# Patient Record
Sex: Male | Born: 1955 | Race: Black or African American | Hispanic: No | Marital: Single | Smoking: Never smoker
Health system: Southern US, Community
[De-identification: ages and names within clinical notes are randomized; demographics above are authoritative.]

## PROBLEM LIST (undated history)

## (undated) DIAGNOSIS — I4891 Unspecified atrial fibrillation: Secondary | ICD-10-CM

## (undated) DIAGNOSIS — F259 Schizoaffective disorder, unspecified: Secondary | ICD-10-CM

## (undated) DIAGNOSIS — M109 Gout, unspecified: Secondary | ICD-10-CM

## (undated) DIAGNOSIS — E119 Type 2 diabetes mellitus without complications: Secondary | ICD-10-CM

## (undated) DIAGNOSIS — N183 Chronic kidney disease, stage 3 unspecified: Secondary | ICD-10-CM

## (undated) DIAGNOSIS — I10 Essential (primary) hypertension: Secondary | ICD-10-CM

## (undated) HISTORY — PX: OTHER SURGICAL HISTORY: SHX169

---

## 1998-02-26 ENCOUNTER — Encounter: Payer: Self-pay | Admitting: Emergency Medicine

## 1998-02-26 ENCOUNTER — Emergency Department (HOSPITAL_COMMUNITY): Admission: EM | Admit: 1998-02-26 | Discharge: 1998-02-26 | Payer: Self-pay | Admitting: Emergency Medicine

## 1999-06-15 ENCOUNTER — Emergency Department (HOSPITAL_COMMUNITY): Admission: EM | Admit: 1999-06-15 | Discharge: 1999-06-15 | Payer: Self-pay | Admitting: Emergency Medicine

## 1999-11-28 ENCOUNTER — Emergency Department (HOSPITAL_COMMUNITY): Admission: EM | Admit: 1999-11-28 | Discharge: 1999-11-28 | Payer: Self-pay | Admitting: Emergency Medicine

## 1999-12-16 ENCOUNTER — Inpatient Hospital Stay (HOSPITAL_COMMUNITY): Admission: EM | Admit: 1999-12-16 | Discharge: 1999-12-17 | Payer: Self-pay | Admitting: Emergency Medicine

## 2002-01-09 DIAGNOSIS — F250 Schizoaffective disorder, bipolar type: Secondary | ICD-10-CM | POA: Diagnosis present

## 2011-01-10 DIAGNOSIS — F1910 Other psychoactive substance abuse, uncomplicated: Secondary | ICD-10-CM | POA: Diagnosis present

## 2015-01-06 DIAGNOSIS — I10 Essential (primary) hypertension: Secondary | ICD-10-CM | POA: Diagnosis present

## 2015-01-06 DIAGNOSIS — M109 Gout, unspecified: Secondary | ICD-10-CM | POA: Diagnosis present

## 2016-02-18 DIAGNOSIS — N183 Chronic kidney disease, stage 3 unspecified: Secondary | ICD-10-CM | POA: Diagnosis present

## 2016-06-25 ENCOUNTER — Emergency Department (HOSPITAL_COMMUNITY)
Admission: EM | Admit: 2016-06-25 | Discharge: 2016-06-25 | Disposition: A | Discharge status: Home / Self Care | Payer: Medicaid Other | Attending: Emergency Medicine | Admitting: Emergency Medicine

## 2016-06-25 ENCOUNTER — Encounter (HOSPITAL_COMMUNITY): Payer: Self-pay

## 2016-06-25 DIAGNOSIS — Z79899 Other long term (current) drug therapy: Secondary | ICD-10-CM | POA: Insufficient documentation

## 2016-06-25 DIAGNOSIS — M10041 Idiopathic gout, right hand: Secondary | ICD-10-CM | POA: Diagnosis not present

## 2016-06-25 DIAGNOSIS — I10 Essential (primary) hypertension: Secondary | ICD-10-CM | POA: Insufficient documentation

## 2016-06-25 DIAGNOSIS — M109 Gout, unspecified: Secondary | ICD-10-CM

## 2016-06-25 HISTORY — DX: Gout, unspecified: M10.9

## 2016-06-25 HISTORY — DX: Essential (primary) hypertension: I10

## 2016-06-25 MED ORDER — PREDNISONE 20 MG PO TABS
60.0000 mg | ORAL_TABLET | Freq: Once | ORAL | Status: AC
  Administered 2016-06-25: 60 mg via ORAL
  Filled 2016-06-25: qty 3

## 2016-06-25 MED ORDER — HYDRALAZINE HCL 10 MG PO TABS
10.0000 mg | ORAL_TABLET | Freq: Once | ORAL | Status: DC
  Filled 2016-06-25 (×2): qty 1

## 2016-06-25 MED ORDER — OXYCODONE-ACETAMINOPHEN 5-325 MG PO TABS
1.0000 | ORAL_TABLET | Freq: Once | ORAL | Status: AC
  Administered 2016-06-25: 1 via ORAL
  Filled 2016-06-25: qty 1

## 2016-06-25 MED ORDER — PREDNISONE 20 MG PO TABS: 60.0000 mg | ORAL_TABLET | Freq: Every day | ORAL | 0 refills | Status: DC

## 2016-06-25 MED ORDER — OXYCODONE-ACETAMINOPHEN 5-325 MG PO TABS: 1.0000 | ORAL_TABLET | ORAL | 0 refills | Status: DC | PRN

## 2016-06-25 NOTE — ED Provider Notes (Signed)
[facility]-EMERGENCY DEPT Provider Note   CSN: [id number] Arrival date & time: 06/25/16  1116  By signing my name below, I, Rosario Adie, attest that this documentation has been prepared under the direction and in the presence of Heide Scales, MD. Electronically Signed: Rosario Adie, ED Scribe. 06/25/16. 12:22 PM.  History   Chief Complaint Chief Complaint  Patient presents with  . Gout   The history is provided by the patient and medical records. No language interpreter was used.  Hand Pain  This is a new problem. The current episode started yesterday. The problem occurs constantly. The problem has been gradually worsening. Pertinent negatives include no chest pain, no abdominal pain, no headaches and no shortness of breath. Exacerbated by: movement of the right hand and elbow. Nothing relieves the symptoms. He has tried nothing for the symptoms. The treatment provided no relief.    HPI Comments: Xavier Ford is a right-hand dominant 61 y.o. male with a h/o HTN, gout, CKD stage III, paroxysmal AFIB (not on anticoagulant therapy 2/2 substance abuse), who presents to the Emergency Department complaining of persistent, gradually worsening, right lateral hand and elbow pain beginning last night. He rates his current pain as 10/10. He notes associated swelling over the hand and lower forearm. Per pt, he has a h/o Gout and his pain today is consistent with usual flare ups. No recent trauma or injury to the hand. His pain is exacerbated with movements of his arm. No noted treatments were tried for his current course of pain. Per prior chart review, he was recently admitted to ICU on 06/15/16 and d/c'd on 06/17/16 for chest pain at rest. CL was positive for ischemia inferoapical and lateral wall, ECHO pEF, DD grade. He was previously on Colchicine but this was was stopped after he was discharged from his admission. He denies urgency, frequency, hematuria, dysuria, difficulty  urinating, constipation, fever, rhinorrhea, cough, chest pain, shortness of breath, or any other associated symptoms.    Past Medical History:  Diagnosis Date  . Gout   . Hypertension    There are no active problems to display for this patient.  History reviewed. No pertinent surgical history.  Home Medications    Prior to Admission medications   Not on File   Family History History reviewed. No pertinent family history.  Social History Social History  Substance Use Topics  . Smoking status: Never Smoker  . Smokeless tobacco: Never Used  . Alcohol use No   Allergies   Nsaids  Review of Systems Review of Systems  Constitutional: Negative for activity change, chills, diaphoresis, fatigue and fever.  HENT: Negative for congestion and rhinorrhea.   Eyes: Negative for visual disturbance.  Respiratory: Negative for cough, chest tightness, shortness of breath, wheezing and stridor.   Cardiovascular: Negative for chest pain, palpitations and leg swelling.  Gastrointestinal: Negative for abdominal distention, abdominal pain, blood in stool, constipation, diarrhea, nausea and vomiting.  Genitourinary: Negative for difficulty urinating, dysuria, flank pain, frequency, hematuria and urgency.  Musculoskeletal: Positive for arthralgias (right hand), joint swelling and myalgias. Negative for back pain and gait problem.  Skin: Negative for rash and wound.  Neurological: Negative for dizziness, weakness, light-headedness and headaches.  Psychiatric/Behavioral: Negative for agitation.  All other systems reviewed and are negative.  Physical Exam Updated Vital Signs BP (!) 186/102 (BP Location: Left Arm)   Pulse 94   Temp 98.7 F (37.1 C) (Oral)   Resp 22   Ht 6\' 4"  (1.93 m)  Wt 240 lb (108.9 kg)   SpO2 100%   BMI 29.21 kg/m   Physical Exam  Constitutional: He is oriented to person, place, and time. He appears well-developed and well-nourished. No distress.  HENT:  Head:  Normocephalic and atraumatic.  Right Ear: External ear normal.  Left Ear: External ear normal.  Nose: Nose normal.  Mouth/Throat: Oropharynx is clear and moist. No oropharyngeal exudate.  Eyes: Conjunctivae and EOM are normal. Pupils are equal, round, and reactive to light.  Neck: Normal range of motion. Neck supple.  Pulmonary/Chest: No stridor. No respiratory distress.  Abdominal: Soft. There is no tenderness. There is no rebound and no guarding.  Musculoskeletal: He exhibits tenderness. He exhibits no edema.  Normal sensation, grip strength limited d/t pain. Tenderness in the lateral hypothenar right hand and elbow. No crepitus, erythema, or fluctuance.   Neurological: He is alert and oriented to person, place, and time. He displays normal reflexes. No cranial nerve deficit. He exhibits normal muscle tone. Coordination normal.  Skin: Skin is warm. No rash noted. He is not diaphoretic. No erythema.   ED Treatments / Results  DIAGNOSTIC STUDIES: Oxygen Saturation is 100% on RA, normal by my interpretation.   COORDINATION OF CARE: 12:19 PM-Discussed next steps with pt. Pt verbalized understanding and is agreeable with the plan.   Labs (all labs ordered are listed, but only abnormal results are displayed) Labs Reviewed - No data to display  EKG  EKG Interpretation None      Radiology No results found.  Procedures Procedures   Medications Ordered in ED Medications  predniSONE (DELTASONE) tablet 60 mg (60 mg Oral Given 06/25/16 1230)  oxyCODONE-acetaminophen (PERCOCET/ROXICET) 5-325 MG per tablet 1 tablet (1 tablet Oral Given 06/25/16 1231)    Initial Impression / Assessment and Plan / ED Course  I have reviewed the triage vital signs and the nursing notes.  Pertinent labs & imaging results that were available during my care of the patient were reviewed by me and considered in my medical decision making (see chart for details).     Xavier Ford is a right-hand  dominant 61 y.o. male with a h/o HTN, gout, CKD stage III, paroxysmal AFIB (not on anticoagulant therapy 2/2 substance abuse), who presents to the Emergency Department complaining of persistent, gradually worsening, right lateral hand and elbow pain beginning last night. Ports his symptoms are "the exact same as my last gout flare". Patient says his pain starts the same, elbow and eventually located to his knees if he does not get pain medication steroids.   On exam, patient has clear lungs and nontender chest or abdomen. Patient has pain in his right elbow and right hand. Patient is right-handed. Patient has normal sensation. Grip strength and range of motion are limited secondary to pain. No evidence of infection on exam with no erythema, crepitance, or fluctuance.  Patient reports that he had to stop taking colchicine due to his new medications and his kidney troubles from his recent admission. Patient says that he continues take his allopurinol.  Patient says that his pain will be relieved with Percocet and steroids. Patient was given these medications and will be reassessed.  Patient reports significant improvement in his discomfort after medications. Patient has full range of motion of his wrist, hand, and elbow. Patient says he still has some pain but is improved.  Given improvement in symptoms, patient was discharged. Patient given prescription for steroids for several days for a burst as well as some pain  medications. Patient instructed to continue his outpatient regimen as directed. Patient instructed to follow-up with PCP for further managed. Patient understood return precautions for new or worsening symptoms. Patient other questions or concerns and patient was discharged in good condition.  Just prior to discharge, patient wanted to take a blood pressure pill. Patient's recent discharge information was reviewed and patient has a prescription for hydralazine. One dose was given orally. Patient  discharged in good condition.     Final Clinical Impressions(s) / ED Diagnoses   Final diagnoses:  Acute gout of right hand, unspecified cause   New Prescriptions New Prescriptions   OXYCODONE-ACETAMINOPHEN (PERCOCET/ROXICET) 5-325 MG TABLET    Take 1 tablet by mouth every 4 (four) hours as needed for severe pain.   PREDNISONE (DELTASONE) 20 MG TABLET    Take 3 tablets (60 mg total) by mouth daily.    I personally performed the services described in this documentation, which was scribed in my presence. The recorded information has been reviewed and is accurate.    Clinical Impression: 1. Acute gout of right hand, unspecified cause     Disposition: Discharge  Condition: Good  I have discussed the results, Dx and Tx plan with the pt(& family if present). He/she/they expressed understanding and agree(s) with the plan. Discharge instructions discussed at great length. Strict return precautions discussed and pt &/or family have verbalized understanding of the instructions. No further questions at time of discharge.    New Prescriptions   OXYCODONE-ACETAMINOPHEN (PERCOCET/ROXICET) 5-325 MG TABLET    Take 1 tablet by mouth every 4 (four) hours as needed for severe pain.   PREDNISONE (DELTASONE) 20 MG TABLET    Take 3 tablets (60 mg total) by mouth daily.    Follow Up: No Pcp Per Patient  Schedule an appointment as soon as possible for a visit    [facility] EMERGENCY DEPARTMENT [address] [id number] [place] [address] [phone]  If symptoms worsen     Heide Scales, MD 06/25/16 Rickey Primus

## 2016-06-25 NOTE — ED Triage Notes (Signed)
Pt reports right arm pain in the hand and his elbow. He reports hx of gout.

## 2016-06-25 NOTE — Discharge Instructions (Signed)
Please take your steroids for the next few days as directed. Please take the pain medicine as needed to help her symptoms. Please scheduled with appointment to follow-up with your PCP for further management as well as further blood pressure management. If any symptoms worsen or don't improve, please return to the nearest emergency department.

## 2016-06-25 NOTE — ED Notes (Signed)
[facility]  Spoke to social work and she stated they could get him a bus pass.  She will meet pt in waiting room.

## 2016-06-25 NOTE — Progress Notes (Signed)
CSW provided Patient with a bus pass and [place] [facility] [place] CSW signing off at this time. Please contact should new need(s) arise.    Enos FlingAshley Adelyn Roscher, MSW, LCSW [facility]/64[facility] Clinical Social Worker [phone]

## 2016-06-25 NOTE — ED Notes (Signed)
PT did not want to wait for med to come up from pharmacy.

## 2016-06-25 NOTE — ED Notes (Signed)
PT states improvement.  Is not bending arm and bp is decreasing.

## 2016-06-25 NOTE — ED Notes (Signed)
PT states only 30 mg oxycontin will work

## 2016-06-26 ENCOUNTER — Encounter (HOSPITAL_COMMUNITY): Payer: Self-pay | Admitting: Emergency Medicine

## 2016-06-26 DIAGNOSIS — I25110 Atherosclerotic heart disease of native coronary artery with unstable angina pectoris: Principal | ICD-10-CM | POA: Diagnosis present

## 2016-06-26 DIAGNOSIS — Z9119 Patient's noncompliance with other medical treatment and regimen: Secondary | ICD-10-CM

## 2016-06-26 DIAGNOSIS — Z7982 Long term (current) use of aspirin: Secondary | ICD-10-CM

## 2016-06-26 DIAGNOSIS — I129 Hypertensive chronic kidney disease with stage 1 through stage 4 chronic kidney disease, or unspecified chronic kidney disease: Secondary | ICD-10-CM | POA: Diagnosis present

## 2016-06-26 DIAGNOSIS — E669 Obesity, unspecified: Secondary | ICD-10-CM | POA: Diagnosis present

## 2016-06-26 DIAGNOSIS — G8929 Other chronic pain: Secondary | ICD-10-CM | POA: Diagnosis present

## 2016-06-26 DIAGNOSIS — I480 Paroxysmal atrial fibrillation: Secondary | ICD-10-CM | POA: Diagnosis present

## 2016-06-26 DIAGNOSIS — F250 Schizoaffective disorder, bipolar type: Secondary | ICD-10-CM | POA: Diagnosis present

## 2016-06-26 DIAGNOSIS — M109 Gout, unspecified: Secondary | ICD-10-CM | POA: Diagnosis present

## 2016-06-26 DIAGNOSIS — N183 Chronic kidney disease, stage 3 (moderate): Secondary | ICD-10-CM | POA: Diagnosis present

## 2016-06-26 DIAGNOSIS — E785 Hyperlipidemia, unspecified: Secondary | ICD-10-CM | POA: Diagnosis present

## 2016-06-26 DIAGNOSIS — I700 Atherosclerosis of aorta: Secondary | ICD-10-CM | POA: Diagnosis present

## 2016-06-26 DIAGNOSIS — F1410 Cocaine abuse, uncomplicated: Secondary | ICD-10-CM | POA: Diagnosis present

## 2016-06-26 DIAGNOSIS — Z79899 Other long term (current) drug therapy: Secondary | ICD-10-CM

## 2016-06-26 DIAGNOSIS — I4892 Unspecified atrial flutter: Secondary | ICD-10-CM | POA: Diagnosis present

## 2016-06-26 LAB — BASIC METABOLIC PANEL
ANION GAP: 11 (ref 5–15)
BUN: 22 mg/dL — ABNORMAL HIGH (ref 6–20)
CALCIUM: 9.7 mg/dL (ref 8.9–10.3)
CHLORIDE: 102 mmol/L (ref 101–111)
CO2: 23 mmol/L (ref 22–32)
Creatinine, Ser: 1.87 mg/dL — ABNORMAL HIGH (ref 0.61–1.24)
GFR calc non Af Amer: 37 mL/min — ABNORMAL LOW (ref >60)
GFR, EST AFRICAN AMERICAN: 43 mL/min — AB (ref >60)
Glucose, Bld: 123 mg/dL — ABNORMAL HIGH (ref 65–99)
POTASSIUM: 3.6 mmol/L (ref 3.5–5.1)
Sodium: 136 mmol/L (ref 135–145)

## 2016-06-26 LAB — CBC
HEMATOCRIT: 40.3 % (ref 39.0–52.0)
HEMOGLOBIN: 13.1 g/dL (ref 13.0–17.0)
MCH: 26.9 pg (ref 26.0–34.0)
MCHC: 32.5 g/dL (ref 30.0–36.0)
MCV: 82.8 fL (ref 78.0–100.0)
Platelets: 328 10*3/uL (ref 150–400)
RBC: 4.87 MIL/uL (ref 4.22–5.81)
RDW: 15.2 % (ref 11.5–15.5)
WBC: 6.3 10*3/uL (ref 4.0–10.5)

## 2016-06-26 LAB — I-STAT TROPONIN, ED: TROPONIN I, POC: 0.04 ng/mL (ref 0.00–0.08)

## 2016-06-26 NOTE — ED Triage Notes (Signed)
Pt c/o left side 10/10 cp and sob that started today, pt states he was seen yesterday for gout pain, but today he is having cp with no relief. No nausea or vomiting no fever.

## 2016-06-27 ENCOUNTER — Encounter (HOSPITAL_COMMUNITY): Payer: Self-pay | Admitting: Family Medicine

## 2016-06-27 ENCOUNTER — Inpatient Hospital Stay (HOSPITAL_COMMUNITY)
Admission: EM | Admit: 2016-06-27 | Discharge: 2016-06-30 | DRG: 287 | Disposition: A | Discharge status: Home / Self Care | Payer: Medicaid Other | Attending: Internal Medicine | Admitting: Internal Medicine

## 2016-06-27 ENCOUNTER — Encounter (HOSPITAL_COMMUNITY)
Admission: EM | Disposition: A | Discharge status: Home / Self Care | Payer: Self-pay | Source: Home / Self Care | Attending: Internal Medicine

## 2016-06-27 ENCOUNTER — Emergency Department (HOSPITAL_COMMUNITY): Payer: Medicaid Other

## 2016-06-27 DIAGNOSIS — R079 Chest pain, unspecified: Secondary | ICD-10-CM | POA: Diagnosis not present

## 2016-06-27 DIAGNOSIS — I480 Paroxysmal atrial fibrillation: Secondary | ICD-10-CM | POA: Diagnosis not present

## 2016-06-27 DIAGNOSIS — N183 Chronic kidney disease, stage 3 unspecified: Secondary | ICD-10-CM | POA: Diagnosis present

## 2016-06-27 DIAGNOSIS — I208 Other forms of angina pectoris: Secondary | ICD-10-CM | POA: Diagnosis not present

## 2016-06-27 DIAGNOSIS — I200 Unstable angina: Secondary | ICD-10-CM | POA: Diagnosis present

## 2016-06-27 DIAGNOSIS — I10 Essential (primary) hypertension: Secondary | ICD-10-CM | POA: Diagnosis not present

## 2016-06-27 DIAGNOSIS — M1A3210 Chronic gout due to renal impairment, right elbow, without tophus (tophi): Secondary | ICD-10-CM | POA: Diagnosis not present

## 2016-06-27 DIAGNOSIS — M109 Gout, unspecified: Secondary | ICD-10-CM | POA: Diagnosis present

## 2016-06-27 DIAGNOSIS — R0789 Other chest pain: Secondary | ICD-10-CM

## 2016-06-27 DIAGNOSIS — R931 Abnormal findings on diagnostic imaging of heart and coronary circulation: Secondary | ICD-10-CM | POA: Diagnosis not present

## 2016-06-27 DIAGNOSIS — I25110 Atherosclerotic heart disease of native coronary artery with unstable angina pectoris: Secondary | ICD-10-CM | POA: Diagnosis not present

## 2016-06-27 DIAGNOSIS — F1910 Other psychoactive substance abuse, uncomplicated: Secondary | ICD-10-CM | POA: Diagnosis not present

## 2016-06-27 DIAGNOSIS — F250 Schizoaffective disorder, bipolar type: Secondary | ICD-10-CM | POA: Diagnosis present

## 2016-06-27 HISTORY — DX: Chronic kidney disease, stage 3 unspecified: N18.30

## 2016-06-27 HISTORY — DX: Chronic kidney disease, stage 3 (moderate): N18.3

## 2016-06-27 HISTORY — PX: LEFT HEART CATH AND CORONARY ANGIOGRAPHY: CATH118249

## 2016-06-27 HISTORY — DX: Unspecified atrial fibrillation: I48.91

## 2016-06-27 HISTORY — DX: Schizoaffective disorder, unspecified: F25.9

## 2016-06-27 LAB — BASIC METABOLIC PANEL
ANION GAP: 9 (ref 5–15)
BUN: 24 mg/dL — AB (ref 6–20)
CALCIUM: 9 mg/dL (ref 8.9–10.3)
CO2: 24 mmol/L (ref 22–32)
Chloride: 105 mmol/L (ref 101–111)
Creatinine, Ser: 1.8 mg/dL — ABNORMAL HIGH (ref 0.61–1.24)
GFR calc Af Amer: 45 mL/min — ABNORMAL LOW (ref >60)
GFR, EST NON AFRICAN AMERICAN: 39 mL/min — AB (ref >60)
Glucose, Bld: 100 mg/dL — ABNORMAL HIGH (ref 65–99)
Potassium: 4.1 mmol/L (ref 3.5–5.1)
Sodium: 138 mmol/L (ref 135–145)

## 2016-06-27 LAB — RAPID URINE DRUG SCREEN, HOSP PERFORMED
Amphetamines: NOT DETECTED
Barbiturates: NOT DETECTED
Benzodiazepines: NOT DETECTED
Cocaine: POSITIVE — AB
OPIATES: NOT DETECTED
TETRAHYDROCANNABINOL: NOT DETECTED

## 2016-06-27 LAB — MRSA PCR SCREENING: MRSA by PCR: NEGATIVE

## 2016-06-27 LAB — TROPONIN I
TROPONIN I: 0.03 ng/mL — AB (ref <0.03)
Troponin I: 0.03 ng/mL (ref <0.03)

## 2016-06-27 LAB — PROTIME-INR
INR: 1.02
PROTHROMBIN TIME: 13.4 s (ref 11.4–15.2)

## 2016-06-27 SURGERY — LEFT HEART CATH AND CORONARY ANGIOGRAPHY
Anesthesia: LOCAL

## 2016-06-27 MED ORDER — IOPAMIDOL (ISOVUE-370) INJECTION 76%
INTRAVENOUS | Status: AC
  Filled 2016-06-27: qty 100

## 2016-06-27 MED ORDER — SODIUM CHLORIDE 0.9% FLUSH
3.0000 mL | Freq: Two times a day (BID) | INTRAVENOUS | Status: DC
  Administered 2016-06-27 – 2016-06-30 (×5): 3 mL via INTRAVENOUS

## 2016-06-27 MED ORDER — FENTANYL CITRATE (PF) 100 MCG/2ML IJ SOLN
INTRAMUSCULAR | Status: AC
  Filled 2016-06-27: qty 2

## 2016-06-27 MED ORDER — SODIUM CHLORIDE 0.9% FLUSH: 3.0000 mL | INTRAVENOUS | Status: DC | PRN

## 2016-06-27 MED ORDER — LIDOCAINE HCL (PF) 1 % IJ SOLN
INTRAMUSCULAR | Status: AC
  Filled 2016-06-27: qty 30

## 2016-06-27 MED ORDER — HEPARIN SODIUM (PORCINE) 1000 UNIT/ML IJ SOLN
INTRAMUSCULAR | Status: DC | PRN
  Administered 2016-06-27: 5000 [IU] via INTRAVENOUS

## 2016-06-27 MED ORDER — ENOXAPARIN SODIUM 40 MG/0.4ML ~~LOC~~ SOLN
40.0000 mg | SUBCUTANEOUS | Status: DC
  Administered 2016-06-28: 40 mg via SUBCUTANEOUS
  Filled 2016-06-27: qty 0.4

## 2016-06-27 MED ORDER — SODIUM CHLORIDE 0.9 % WEIGHT BASED INFUSION: 1.0000 mL/kg/h | INTRAVENOUS | Status: DC

## 2016-06-27 MED ORDER — ALLOPURINOL 300 MG PO TABS
300.0000 mg | ORAL_TABLET | Freq: Every day | ORAL | Status: DC
  Administered 2016-06-27 – 2016-06-30 (×4): 300 mg via ORAL
  Filled 2016-06-27 (×4): qty 1

## 2016-06-27 MED ORDER — HEPARIN (PORCINE) IN NACL 2-0.9 UNIT/ML-% IJ SOLN
INTRAMUSCULAR | Status: DC | PRN
  Administered 2016-06-27: 1000 mL

## 2016-06-27 MED ORDER — GABAPENTIN 300 MG PO CAPS
300.0000 mg | ORAL_CAPSULE | Freq: Three times a day (TID) | ORAL | Status: DC
  Administered 2016-06-27 – 2016-06-30 (×9): 300 mg via ORAL
  Filled 2016-06-27 (×9): qty 1

## 2016-06-27 MED ORDER — SODIUM CHLORIDE 0.9 % IV SOLN: 250.0000 mL | INTRAVENOUS | Status: DC | PRN

## 2016-06-27 MED ORDER — HEPARIN (PORCINE) IN NACL 2-0.9 UNIT/ML-% IJ SOLN
INTRAMUSCULAR | Status: AC
  Filled 2016-06-27: qty 1000

## 2016-06-27 MED ORDER — MIDAZOLAM HCL 2 MG/2ML IJ SOLN
INTRAMUSCULAR | Status: AC
  Filled 2016-06-27: qty 2

## 2016-06-27 MED ORDER — BENZTROPINE MESYLATE 1 MG PO TABS
1.0000 mg | ORAL_TABLET | Freq: Two times a day (BID) | ORAL | Status: DC
  Administered 2016-06-27 – 2016-06-30 (×7): 1 mg via ORAL
  Filled 2016-06-27 (×7): qty 1

## 2016-06-27 MED ORDER — ACETAMINOPHEN 325 MG PO TABS: 650.0000 mg | ORAL_TABLET | ORAL | Status: DC | PRN

## 2016-06-27 MED ORDER — ONDANSETRON HCL 4 MG/2ML IJ SOLN: 4.0000 mg | Freq: Four times a day (QID) | INTRAMUSCULAR | Status: DC | PRN

## 2016-06-27 MED ORDER — ATORVASTATIN CALCIUM 80 MG PO TABS
80.0000 mg | ORAL_TABLET | Freq: Every day | ORAL | Status: DC
  Administered 2016-06-27 – 2016-06-30 (×4): 80 mg via ORAL
  Filled 2016-06-27 (×4): qty 1

## 2016-06-27 MED ORDER — ASPIRIN 325 MG PO TABS
325.0000 mg | ORAL_TABLET | Freq: Every day | ORAL | Status: DC
  Administered 2016-06-27: 325 mg via ORAL
  Filled 2016-06-27: qty 1

## 2016-06-27 MED ORDER — ASPIRIN 81 MG PO CHEW: 81.0000 mg | CHEWABLE_TABLET | ORAL | Status: DC

## 2016-06-27 MED ORDER — HEPARIN SODIUM (PORCINE) 1000 UNIT/ML IJ SOLN
INTRAMUSCULAR | Status: AC
  Filled 2016-06-27: qty 1

## 2016-06-27 MED ORDER — MIDAZOLAM HCL 2 MG/2ML IJ SOLN
INTRAMUSCULAR | Status: DC | PRN
  Administered 2016-06-27: 1 mg via INTRAVENOUS

## 2016-06-27 MED ORDER — GI COCKTAIL ~~LOC~~: 30.0000 mL | Freq: Four times a day (QID) | ORAL | Status: DC | PRN

## 2016-06-27 MED ORDER — SODIUM CHLORIDE 0.9 % IV SOLN: INTRAVENOUS | Status: AC

## 2016-06-27 MED ORDER — IOPAMIDOL (ISOVUE-370) INJECTION 76%
INTRAVENOUS | Status: DC | PRN
  Administered 2016-06-27: 50 mL via INTRA_ARTERIAL

## 2016-06-27 MED ORDER — LIDOCAINE HCL (PF) 1 % IJ SOLN
INTRAMUSCULAR | Status: DC | PRN
  Administered 2016-06-27: 1 mL via INTRADERMAL

## 2016-06-27 MED ORDER — SODIUM CHLORIDE 0.9 % WEIGHT BASED INFUSION
3.0000 mL/kg/h | INTRAVENOUS | Status: DC
  Administered 2016-06-27: 3 mL/kg/h via INTRAVENOUS

## 2016-06-27 MED ORDER — ASPIRIN 81 MG PO CHEW
81.0000 mg | CHEWABLE_TABLET | Freq: Every day | ORAL | Status: DC
  Administered 2016-06-28 – 2016-06-30 (×3): 81 mg via ORAL
  Filled 2016-06-27 (×3): qty 1

## 2016-06-27 MED ORDER — SODIUM CHLORIDE 0.9% FLUSH
3.0000 mL | Freq: Two times a day (BID) | INTRAVENOUS | Status: DC
Start: 2016-06-27 — End: 2016-06-27

## 2016-06-27 MED ORDER — VERAPAMIL HCL 2.5 MG/ML IV SOLN
INTRAVENOUS | Status: DC | PRN
  Administered 2016-06-27: 10 mL via INTRA_ARTERIAL

## 2016-06-27 MED ORDER — PREDNISONE 50 MG PO TABS
60.0000 mg | ORAL_TABLET | Freq: Every day | ORAL | Status: AC
  Administered 2016-06-27 – 2016-06-29 (×3): 60 mg via ORAL
  Filled 2016-06-27 (×3): qty 1

## 2016-06-27 MED ORDER — ASPIRIN 81 MG PO CHEW
324.0000 mg | CHEWABLE_TABLET | Freq: Once | ORAL | Status: AC
  Administered 2016-06-27: 324 mg via ORAL
  Filled 2016-06-27: qty 4

## 2016-06-27 MED ORDER — TRAMADOL HCL 50 MG PO TABS
50.0000 mg | ORAL_TABLET | Freq: Four times a day (QID) | ORAL | Status: DC | PRN
  Administered 2016-06-27 – 2016-06-28 (×5): 50 mg via ORAL
  Filled 2016-06-27 (×5): qty 1

## 2016-06-27 MED ORDER — FENTANYL CITRATE (PF) 100 MCG/2ML IJ SOLN
INTRAMUSCULAR | Status: DC | PRN
  Administered 2016-06-27: 50 ug via INTRAVENOUS

## 2016-06-27 MED ORDER — VERAPAMIL HCL 2.5 MG/ML IV SOLN
INTRAVENOUS | Status: AC
  Filled 2016-06-27: qty 2

## 2016-06-27 MED ORDER — ISOSORBIDE MONONITRATE ER 30 MG PO TB24
30.0000 mg | ORAL_TABLET | Freq: Every day | ORAL | Status: DC
  Administered 2016-06-27 – 2016-06-30 (×4): 30 mg via ORAL
  Filled 2016-06-27 (×4): qty 1

## 2016-06-27 MED ORDER — NITROGLYCERIN 0.4 MG SL SUBL
0.4000 mg | SUBLINGUAL_TABLET | SUBLINGUAL | Status: AC | PRN
  Administered 2016-06-27 (×3): 0.4 mg via SUBLINGUAL
  Filled 2016-06-27: qty 1

## 2016-06-27 SURGICAL SUPPLY — 9 items
CATH 5FR JL3.5 JR4 ANG PIG MP (CATHETERS) ×2 IMPLANT
DEVICE RAD COMP TR BAND LRG (VASCULAR PRODUCTS) ×2 IMPLANT
GLIDESHEATH SLEND SS 6F .021 (SHEATH) ×2 IMPLANT
GUIDEWIRE INQWIRE 1.5J.035X260 (WIRE) ×1 IMPLANT
INQWIRE 1.5J .035X260CM (WIRE) ×2
KIT HEART LEFT (KITS) ×2 IMPLANT
PACK CARDIAC CATHETERIZATION (CUSTOM PROCEDURE TRAY) ×2 IMPLANT
TRANSDUCER W/STOPCOCK (MISCELLANEOUS) ×2 IMPLANT
TUBING CIL FLEX 10 FLL-RA (TUBING) ×2 IMPLANT

## 2016-06-27 NOTE — Progress Notes (Signed)
Pt admitted after midnight, for details please refer to admission note done 06/27/2016. Cardio seen in consultation. Plan for cath today.  Manson Passeylma Steffanie Mingle [facility] [phone]

## 2016-06-27 NOTE — ED Notes (Signed)
ED Provider at bedside. 

## 2016-06-27 NOTE — Care Management Note (Signed)
Case Management Note  Patient Details  Name: Xavier RenshawJoseph L Ford MRN: [id number] Date of Birth: 1956-04-01  Subjective/Objective:   Presents with chest pain,psa, for heart cath today, NCM will cont to follow for dc needs.                 Action/Plan:   Expected Discharge Date:                  Expected Discharge Plan:  Home/Self Care  In-House Referral:     Discharge planning Services  CM Consult  Post Acute Care Choice:    Choice offered to:     DME Arranged:    DME Agency:     HH Arranged:    HH Agency:     Status of Service:  In process, will continue to follow  If discussed at Long Length of Stay Meetings, dates discussed:    Additional Comments:  Leone Havenaylor, Caasi Giglia [place], RN 06/27/2016, 3:10 PM

## 2016-06-27 NOTE — ED Provider Notes (Signed)
TIME SEEN: 3:39 AM By signing my name below, I, Arianna Nassar, attest that this documentation has been prepared under the direction and in the presence of Enbridge EnergyKristen N Hanya Guerin, DO.  Electronically Signed: Octavia HeirArianna Nassar, ED Scribe. 06/27/16. 3:46 AM.  CHIEF COMPLAINT:  Chief Complaint  Patient presents with  . Chest Pain  . Shortness of Breath    HPI:  HPI Comments: Xavier Ford is a [age] y.o. male who has a PMhx of HTN, chronic kidney disease, paroxysmal atrial fibrillation, substance abuse presents to the Emergency Department complaining of persistent, gradually worsening, intermittent, left sided chest pressure that radiates to his left lower ribs since yesterday. He reports associated shortness of breath. Pt says that certain positions like laying down and bending over increases his chest pain. He does not think it is exertional or pleuritic. [facility]  He is a cocaine user and expresses the last time he used cocaine was last week. Pt reports having a [facility] which resulted as abnormal and it was recommended to have a heart catheterization performed. He notes he declined because he was scared. He does not have a current PCP or cardiologist, but states he has an [place]. Pt denies diaphoresis, dizziness, nausea, vomiting, fever, cough or any other complaints.   Echo 06/16/16:  Interpretation Summary A complete two-dimensional transthoracic echocardiogram with color flow Doppler and spectral Doppler was performed. The study was technically adequate. The left ventricle is grossly normal size. There is moderate concentric left ventricular hypertrophy. The left ventricular ejection fraction is normal (55-60%). The left ventricular wall motion is normal. The aortic valve is trileaflet. Mild aortic sclerosis is present with good valvular opening. Grade I mild diastolic  dysfunction; abnormal relaxation pattern. Estimation of right ventricular systolic pressure is not possible.  Stress Test 06/16/16:  INDICATION: Chest pain.  TECHNIQUE: Stress and resting perfusion images following intravenous demonstration of 50.7 mCi technetium 3027m Cardiolite.  FINDINGS: Inferoapical and lateral wall reperfusion suggesting ischemia. The left ventricular ejection fraction is calculated to be 60%. Normal wall motion.   ROS: See HPI Constitutional: no fever  Eyes: no drainage  ENT: no runny nose   Cardiovascular:  (+) chest pain  Resp: (+) SOB  GI: no vomiting GU: no dysuria Integumentary: no rash  Allergy: no hives  Musculoskeletal: no leg swelling  Neurological: no slurred speech ROS otherwise negative  PAST MEDICAL HISTORY/PAST SURGICAL HISTORY:  Past Medical History:  Diagnosis Date  . Gout   . Hypertension     MEDICATIONS:  Prior to Admission medications   Medication Sig Start Date End Date Taking? Authorizing Provider  oxyCODONE-acetaminophen (PERCOCET/ROXICET) 5-325 MG tablet Take 1 tablet by mouth every 4 (four) hours as needed for severe pain. 06/25/16   Canary Brimhristopher J Tegeler, MD  predniSONE (DELTASONE) 20 MG tablet Take 3 tablets (60 mg total) by mouth daily. 06/25/16 06/30/16  Canary Brimhristopher J Tegeler, MD    ALLERGIES:  Allergies  Allergen Reactions  . Nsaids Hives    SOCIAL HISTORY:  Social History  Substance Use Topics  . Smoking status: Never Smoker  . Smokeless tobacco: Never Used  . Alcohol use No    FAMILY HISTORY: No family history on file.  EXAM: BP 123/86 (BP Location: Left Arm)   Pulse 89   Temp 98.6 F (37 C) (Oral)   Resp 19   SpO2 93%  CONSTITUTIONAL: Alert and oriented and responds appropriately to questions. Chronically ill-appearing, afebrile, obese, nontoxic appearing HEAD: Normocephalic EYES: Conjunctivae clear, pupils appear equal, EOMI ENT: normal nose; moist mucous membranes NECK: Supple, no meningismus, no  nuchal rigidity, no LAD  CARD: RRR; S1 and S2 appreciated; no murmurs, no clicks, no rubs, no gallops RESP: Normal chest excursion without splinting or tachypnea; breath sounds clear and equal bilaterally; no wheezes, no rhonchi, no rales, no hypoxia or respiratory distress, speaking full sentences ABD/GI: Normal bowel sounds; non-distended; soft, non-tender, no rebound, no guarding, no peritoneal signs, no hepatosplenomegaly BACK:  The back appears normal and is non-tender to palpation, there is no CVA tenderness EXT: Normal ROM in all joints; non-tender to palpation; no edema; normal capillary refill; no cyanosis, no calf tenderness or swelling    SKIN: Normal color for age and race; warm; no rash NEURO: Moves all extremities equally PSYCH: The patient's mood and manner are appropriate. Grooming and personal hygiene are appropriate.  MEDICAL DECISION MAKING: Patient here with chest pain. Recently had abnormal stress test and a cardiac catheterization was recommended at no vomiting although beginning of March. He refused because he states "I was scared". Also has history of cocaine abuse. States he has not used in over a week. We'll check a urine drug screen. EKG shows no ischemic abnormality currently and troponin is negative. Will discuss with cardiology on call.  ED PROGRESS: 4:10 AM  D/w Dr. Clarnce Flock, Cardiology fellow on call. We will offer patient cardiac catheterization likely in the morning. He recommends medicine admission. They will see the patient in consult.    4:34 AM Discussed patient's case with hospitalist, Dr. Maryfrances Bunnell.  I have recommended admission and patient (and family if present) agree with this plan. Admitting physician will place admission orders.   I reviewed all nursing notes, vitals, pertinent previous records, EKGs, lab and urine results, imaging (as available).      EKG Interpretation  Date/Time:  Thursday June 26 2016 23:15:01 EDT Ventricular Rate:  95 PR  Interval:  154 QRS Duration: 82 QT Interval:  370 QTC Calculation: 464 R Axis:   -45 Text Interpretation:  Normal sinus rhythm Possible Left atrial enlargement Left axis deviation Pulmonary disease pattern Abnormal ECG No old tracing to compare Confirmed by Janzen Sacks,  DO, Tytionna Cloyd ([id number]) on 06/27/2016 3:22:26 AM        I personally performed the services described in this documentation, which was scribed in my presence. The recorded information has been reviewed and is accurate.    Layla Maw Ladasha Schnackenberg, DO 06/27/16 [phone]

## 2016-06-27 NOTE — Brief Op Note (Signed)
Brief Cardiac Catheterization Note (Full Report to Follow)  Date: 06/27/2016 Time: 1:56 PM  PATIENT:  Xavier RenshawJoseph L Vanes  61 y.o. male  PRE-OPERATIVE DIAGNOSIS:  unstable angina  POST-OPERATIVE DIAGNOSIS:  Non-obstructive coronary artery disease  PROCEDURE:  Procedure(s): Left Heart Cath and Coronary Angiography (N/A)  SURGEON:  Surgeon(s) and Role:    * Yvonne Kendallhristopher Jonty Morrical, MD - Primary  FINDINGS: 1.  Large, ectatic coronary arteries. 2.  Mild to moderate, diffuse LAD disease of up to 50%. 3.  Normal left ventricular filling pressure.  RECOMMENDATIONS: 1.  Medical management and risk factor modification.  Yvonne Kendallhristopher Magen Suriano, MD [facility] Pager: [phone]

## 2016-06-27 NOTE — Progress Notes (Addendum)
TR BAND REMOVAL  LOCATION:    Right radial  DEFLATED PER PROTOCOL:    Yes.    TIME BAND OFF / DRESSING APPLIED:    1545p   SITE UPON ARRIVAL:    Level 0  SITE AFTER BAND REMOVAL:    Level 0  CIRCULATION SENSATION AND MOVEMENT:    Within Normal Limits   Yes.    COMMENTS:   No complaint of pain  at site.  Pt able to move ext without any difficulty.  2+ palpable pulse.

## 2016-06-27 NOTE — H&P (Signed)
History and Physical  Patient Name: Xavier RenshawJoseph L Barcomb     ZOX:[id number]    DOB: 1955/06/20    DOA: 06/27/2016 PCP: No PCP Per Patient   Patient coming from: Home     Chief Complaint: Chest pain  HPI: Xavier RenshawJoseph L Ford is a 61 y.o. male with a past medical history significant for polysubstance abuse including cocaine, hx of Atrial fibrillation not on AC because of compliance, Schizoaffective disorder, CKD III baseline Cr 1.6 who presents with chest pain.  The patient notes that he uses cocaine every few weeks, and each time, a few days after, he gets severe chest pain, lasting about a day or two, associated with diaphoresis/drenching sweats, shortness of breath, and fatigue.  He was admitted to [facility] at the beginning of this month for same, was seen by Cardiology there, underwent Cadriolite there that showed "Inferoapical and lateral wall reperfusion suggesting ischemia. The left ventricular ejection fraction is calculated to be 60%. Normal wall motion."  They offered LHC but he left AMA.  Since then, [place] [facility] [place] [facility] [place] program of some sort.  About a week ago he used cocaine, and then yesterday he had drenching sweats and onset of chest pain.  This has been intermittent since then, worsened with position, not clearly exertional, progressively worse, 10/10 tonight, and assocaited with SOB, so he came to the ER.   ED course: -Afebrile, heart rate 99, respirations and pulse ox normal, BP 143/84 -Initial ECG showed sinus tachycardia and troponin was negative. -Na 136, K 3.6, Cr 1.84 (baseline 1.6-2.5), WBC 6.2, Hgb 13.1 -CXR showed cardiomegaly without pneumonia -He was given aspirin 325 and nitroglycerin and the case was discussed with Cardiology who recommended medical admission for Cardiology consultation today        Review of Systems:  Review of Systems  Constitutional: Negative for chills and fever.  Respiratory: Negative for cough and sputum production.     Cardiovascular: Positive for chest pain. Negative for palpitations, orthopnea, leg swelling and PND.  Musculoskeletal: Positive for joint pain (chronic).     Past Medical History:  Diagnosis Date  . Atrial fibrillation (HCC)   . CKD (chronic kidney disease), stage III   . Gout   . Hypertension   . Schizoaffective disorder [facility](HCC)     Past Surgical History:  Procedure Laterality Date  . Left finger pinning    . ORIF left radius      Social History: Patient lives currently in a group home as part of his treatment program.  Patient walks unassisted.  He is from [place].  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    Allergies  Allergen Reactions  . Nsaids Hives  . Gabapentin     Other reaction(s): Cough (ALLERGY/intolerance), GI Upset (intolerance) Patient states after taking the medication, his throat started swelling, he had a horrible headache and abd cramping.  . Morphine And Related Itching    Family history: family history includes Hyperlipidemia in his maternal grandmother.  Prior to Admission medications   Medication Sig Start Date End Date Taking? Authorizing Provider  allopurinol (ZYLOPRIM) 300 MG tablet Take 300 mg by mouth daily.   Yes Historical Provider, MD  aspirin 325 MG tablet Take 325 mg by mouth daily.  Yes Historical Provider, MD  atorvastatin (LIPITOR) 80 MG tablet Take 80 mg by mouth daily.   Yes Historical Provider, MD  benztropine (COGENTIN) 1 MG tablet Take 1 mg by mouth 2 (two) times daily.   Yes Historical Provider, MD  diltiazem (CARDIZEM CD) 180 MG 24 hr capsule Take 180 mg by mouth daily.   Yes Historical Provider, MD  docusate sodium (COLACE) 100 MG capsule Take 100 mg by mouth 2 (two) times daily as needed for mild  constipation.   Yes Historical Provider, MD  gabapentin (NEURONTIN) 300 MG capsule Take 300 mg by mouth 3 (three) times daily.   Yes Historical Provider, MD  isosorbide mononitrate (IMDUR) 30 MG 24 hr tablet Take 30 mg by mouth daily.   Yes Historical Provider, MD  methocarbamol (ROBAXIN) 500 MG tablet Take 500 mg by mouth 3 (three) times daily.   Yes Historical Provider, MD  nitroGLYCERIN (NITROSTAT) 0.4 MG SL tablet Place 0.4 mg under the tongue every 5 (five) minutes as needed for chest pain.   Yes Historical Provider, MD  oxyCODONE-acetaminophen (PERCOCET/ROXICET) 5-325 MG tablet Take 1 tablet by mouth every 4 (four) hours as needed for severe pain. 06/25/16  Yes Canary Brim Tegeler, MD  Paliperidone Palmitate (INVEGA SUSTENNA) 39 MG/0.25ML SUSP Inject 39 mg into the muscle every 28 (twenty-eight) days.   Yes Historical Provider, MD  predniSONE (DELTASONE) 20 MG tablet Take 3 tablets (60 mg total) by mouth daily. 06/25/16 06/30/16 Yes Canary Brim Tegeler, MD  senna-docusate (SENOKOT-S) 8.6-50 MG tablet Take 1 tablet by mouth 2 (two) times daily as needed for mild constipation.   Yes Historical Provider, MD  Vitamin D, Ergocalciferol, (DRISDOL) 50000 units CAPS capsule Take 50,000 Units by mouth every 7 (seven) days.   Yes Historical Provider, MD  zolpidem (AMBIEN) 10 MG tablet Take 10 mg by mouth at bedtime as needed for sleep.   Yes Historical Provider, MD       Physical Exam: BP (!) 135/102   Pulse 90   Temp 98.6 F (37 C) (Oral)   Resp 18   SpO2 94%  General appearance: Well-developed, adult male, alert and in no acute distress, appears comfortable.   Eyes: Anicteric, conjunctiva pink, lids and lashes normal.     ENT: No nasal deformity, discharge, or epistaxis.  OP moist without lesions.   Skin: Warm and dry.   Cardiac: RRR, nl S1-S2, no murmurs appreciated.  Capillary refill is brisk.  JVP normal.  No LE edema.  Radial and DP pulses 2+ and symmetric.  No carotid  bruits. Respiratory: Normal respiratory rate and rhythm.  CTAB without rales or wheezes. GI: Abdomen soft without rigidity.  No TTP. No ascites, distension.   MSK: No deformities or effusions.   Pain not reproduced with palpation of precordium.  No pain with arm movement. Neuro: Sensorium intact and responding to questions, attention normal.  Speech is fluent.  Moves all extremities equally and with normal coordination.    Psych: Behavior odd.  Affect relaxed, disinhibited.  No evidence of aural or visual hallucinations or delusions.       Labs on Admission:  The metabolic panel shows chronic kidney insufficiency. The complete blood count shows normal counts. The initial troponin is negative.  Radiological Exams on Admission: Personally reviewed CXR shows no focal opacity or airspace disease: Dg Chest 2 View  Result Date: 06/27/2016 CLINICAL DATA:  Chest pain and shortness of breath EXAM: CHEST  2 VIEW COMPARISON:  None. FINDINGS: Low lung volumes.  Limited lateral view due to patient's overlying soft tissues. No pleural effusion. Streaky atelectasis at the left base and lingula. Mild cardiomegaly. No pneumothorax. IMPRESSION: 1. Mild cardiomegaly without overt failure 2. Streaky atelectasis at the left lung base. Electronically Signed   By: Jasmine Pang M.D.   On: 06/27/2016 00:11    EKG: Independently reviewed. Rate 95, QTc 464, LAD and LAE.  Echocardiogram report reviewed from [facility] this month: The left ventricle is grossly normal size. There is moderate concentric left ventricular hypertrophy. The left ventricular ejection fraction is normal (55-60%). The left ventricular wall motion is normal. The aortic valve is trileaflet. Mild aortic sclerosis is present with good valvular opening. Grade I mild diastolic dysfunction; abnormal relaxation pattern. Estimation of right ventricular systolic pressure is not possible.    Assessment/Plan  1. Chest pain: The patient has HEART  score of 4. Angina is atypical.  Cocaine vasospasm likely.  However, he had a recent abnormal stress test and our Cardiology have recmomended admission for formal Cardiology consultation.   -Serial troponins are ordered -Telemetry -Consult to cardiology, appreciate recommendations -Cocaine cessation was strongly recommended -Continue Imdur, aspirin, statin -Hold diltiazem until seen by Cardiology   2. Atrial fibrillation:  CHADS2Vasc 1.  Not on AC.  Outside records report he is supposed to be on amiodarone and he tells me he is, but he does not have the medicine in his back pack when PharmT went through. -Restart Diltiazem pending cardiology plans  3. Gout:  -Continue allopurinol -Continue prednisone, prescribed this week  4. Schizoaffective disorder:  -Continue Cogentin -On Invega sustenna  5. Chronic pain:  -May have tramadol for pain not controlled with acetaminophen -Continue gabapentin  6. Hypertension: -Continue Imdur -Diltiazem held this morning   DVT prophylaxis: Lovenox Diet: NPO after 4am for anticipated stress testing Code Status: FULL  Family Communication: None present  Disposition Plan: Anticipate overnight observation for arrhythmia on telemetry, serial troponins and subsequent risk stratification by Cardiology.  If testing negative, home after. Consults called: Cardiology via Inbasket Admission status: Telemetry   Medical decision making: Patient seen at 5:18 AM on 06/27/2016.  The patient was discussed with Dr. Elesa Massed. What exists of the patient's chart was reviewed in depth and outside records were reviewed and summarized in care everywhere.  Clinical condition: stable.      Alberteen Sam [facility] Pager [phone]

## 2016-06-27 NOTE — ED Notes (Addendum)
Notified Dr. Maryfrances Bunnellanford that pt still having 8/10 CP after 3 Nitro's. Doctor will change bed order to Step Down.

## 2016-06-27 NOTE — H&P (View-Only) (Signed)
Reason for Consult: chest pain   Referring Physician: Dr.  Loleta Books  PCP:  No PCP Per Patient  Primary Cardiologist:new--Dr. Mazi Brailsford  Xavier Ford is an 61 y.o. male.    Chief Complaint: admitted last pm with Lt sided chest pain and SOB.     HPI:   We have been asked to evaluate 19 yoM for chest pain.  Hx of A fib in computer- occurred with cocaine use and converted spontaneously and HTN.  Lisinopril stopped for elevated Cr.    + substance abuse and used cocaine last week.  Lives in rehab home with drug screen weekly.  Pt seen 06/25/16 for gout pain and then last night presented with chest pain.   He was seen this month at [facility] and had stress test on 06/16/16 'Inferoapical and lateral wall reperfusion suggesting ischemia. The left ventricular ejection fraction is calculated to be 60%. Normal wall motion."  Echo 06/16/16 Interpretation Summary A complete two-dimensional transthoracic echocardiogram with color flow Doppler and spectral Doppler was performed. The study was technically adequate. The left ventricle is grossly normal size. There is moderate concentric left ventricular hypertrophy. The left ventricular ejection fraction is normal (55-60%). The left ventricular wall motion is normal. The aortic valve is trileaflet. Mild aortic sclerosis is present with good valvular opening. Grade I mild diastolic dysfunction; abnormal relaxation pattern. Estimation of right ventricular systolic pressure is not possible.   They recommended cardiac cath but he was too afraid to have done.  EKG at that time with lateral ischemia.  On CT scan at [facility] he had coronary calcifications- heavy burden.    He woke yesterday with cold type symptoms then once he got up and moved around he had chest pain.  Currently minimal pain.    Troponin poc at 0.04, tropoin I 0.04.    Cr. 1.87 BUN 22 CBC wnl.   EKG  SR LAD CXR with mild cardiomegaly no CHF.    Past Medical History:    Diagnosis Date  . Atrial fibrillation ([facility])   . CKD (chronic kidney disease), stage III   . Gout   . Hypertension   . Schizoaffective disorder [facility])     Past Surgical History:  Procedure Laterality Date  . Left finger pinning    . ORIF left radius      Family History  Problem Relation Age of Onset  . Hyperlipidemia Maternal Grandmother    Social History:  reports that he has never smoked. He has never used smokeless tobacco. He reports that he uses drugs, including Cocaine. He reports that he does not drink alcohol.  Allergies:  Allergies  Allergen Reactions  . Nsaids Hives  . Gabapentin     Other reaction(s): Cough (ALLERGY/intolerance), GI Upset (intolerance) Patient states after taking the medication, his throat started swelling, he had a horrible headache and abd cramping.  . Morphine And Related Itching    OUTPATIENT MEDICATIONS: No current facility-administered medications on file prior to encounter.    Current Outpatient Prescriptions on File Prior to Encounter  Medication Sig Dispense Refill  . oxyCODONE-acetaminophen (PERCOCET/ROXICET) 5-325 MG tablet Take 1 tablet by mouth every 4 (four) hours as needed for severe pain. 18 tablet 0  . predniSONE (DELTASONE) 20 MG tablet Take 3 tablets (60 mg total) by mouth daily. 15 tablet 0   CURRENT MEDICATIONS: Scheduled Meds: . allopurinol  300 mg Oral Daily  . aspirin  325 mg Oral Daily  .  atorvastatin  80 mg Oral Daily  . benztropine  1 mg Oral BID  . enoxaparin (LOVENOX) injection  40 mg Subcutaneous Q24H  . gabapentin  300 mg Oral TID  . isosorbide mononitrate  30 mg Oral Daily  . predniSONE  60 mg Oral Daily   Continuous Infusions: PRN Meds:.acetaminophen, gi cocktail, ondansetron (ZOFRAN) IV, traMADol   Results for orders placed or performed during the hospital encounter of 06/27/16 (from the past 48 hour(s))  Basic metabolic panel     Status: Abnormal   Collection Time: 06/26/16 11:19 PM  Result Value  Ref Range   Sodium 136 135 - 145 mmol/L   Potassium 3.6 3.5 - 5.1 mmol/L   Chloride 102 101 - 111 mmol/L   CO2 23 22 - 32 mmol/L   Glucose, Bld 123 (H) 65 - 99 mg/dL   BUN 22 (H) 6 - 20 mg/dL   Creatinine, Ser 1.87 (H) 0.61 - 1.24 mg/dL   Calcium 9.7 8.9 - 10.3 mg/dL   GFR calc non Af Amer 37 (L) >60 mL/min   GFR calc Af Amer 43 (L) >60 mL/min    Comment: (NOTE) The eGFR has been calculated using the CKD EPI equation. This calculation has not been validated in all clinical situations. eGFR's persistently <60 mL/min signify possible Chronic Kidney Disease.    Anion gap 11 5 - 15  CBC     Status: None   Collection Time: 06/26/16 11:19 PM  Result Value Ref Range   WBC 6.3 4.0 - 10.5 K/uL   RBC 4.87 4.22 - 5.81 MIL/uL   Hemoglobin 13.1 13.0 - 17.0 g/dL   HCT 40.3 39.0 - 52.0 %   MCV 82.8 78.0 - 100.0 fL   MCH 26.9 26.0 - 34.0 pg   MCHC 32.5 30.0 - 36.0 g/dL   RDW 15.2 11.5 - 15.5 %   Platelets 328 150 - 400 K/uL  I-stat troponin, ED     Status: None   Collection Time: 06/26/16 11:41 PM  Result Value Ref Range   Troponin i, poc 0.04 0.00 - 0.08 ng/mL   Comment 3            Comment: Due to the release kinetics of cTnI, a negative result within the first hours of the onset of symptoms does not rule out myocardial infarction with certainty. If myocardial infarction is still suspected, repeat the test at appropriate intervals.   Troponin I-serum (0, 3, 6 hours)     Status: Abnormal   Collection Time: 06/27/16  5:31 AM  Result Value Ref Range   Troponin I 0.03 (HH) <0.03 ng/mL    Comment: CRITICAL RESULT CALLED TO, READ BACK BY AND VERIFIED WITH: PHILLIPS K,RN 06/27/16 [id number]    Dg Chest 2 View  Result Date: 06/27/2016 CLINICAL DATA:  Chest pain and shortness of breath EXAM: CHEST  2 VIEW COMPARISON:  None. FINDINGS: Low lung volumes. Limited lateral view due to patient's overlying soft tissues. No pleural effusion. Streaky atelectasis at the left base and lingula. Mild  cardiomegaly. No pneumothorax. IMPRESSION: 1. Mild cardiomegaly without overt failure 2. Streaky atelectasis at the left lung base. Electronically Signed   By: Donavan Foil M.D.   On: 06/27/2016 00:11    ROS: General:+ colds symptoms yesterday, no fevers, no weight changes Skin:no rashes or ulcers HEENT:no blurred vision, no congestion CV:see HPI PUL:see HPI GI:no diarrhea constipation or melena, no indigestion GU:no hematuria, no dysuria MS:no joint pain, no claudication, + gout Neuro:no  syncope, no lightheadedness Endo:no diabetes, no thyroid disease Psych:  Stable today, but helps to explain slowly he becomes anxious.   Blood pressure 133/77, pulse 64, temperature 98.7 F (37.1 C), temperature source Oral, resp. rate (!) 23, height 6' (1.829 m), weight 243 lb 9.7 oz (110.5 kg), SpO2 98 %.  Wt Readings from Last 3 Encounters:  06/27/16 243 lb 9.7 oz (110.5 kg)  06/25/16 240 lb (108.9 kg)    PE: General:Pleasant affect, NAD Skin:Warm and dry, brisk capillary refill HEENT:normocephalic, sclera clear, mucus membranes moist Neck:supple, no JVD, no bruits  Heart:S1S2 RRR without murmur, gallup, rub or click Lungs:clear without rales, rhonchi, or wheezes FUQ:XAFH, non tender, + BS, do not palpate liver spleen or masses Ext:no lower ext edema, 2+ pedal pulses, 2+ radial pulses Neuro:alert and oriented X 3, MAE, follows commands, + facial symmetry Psych:  Mildly anxious about any procedure -as above pleasant affect.    Assessment/Plan Principal Problem:   Chest pain Active Problems:   CKD (chronic kidney disease) stage 3, GFR 30-59 ml/min   Gout   Hypertension   Polysubstance abuse   Schizoaffective disorder, bipolar type (HCC)   AF (paroxysmal atrial fibrillation) ([place])  1. Chest pain with mild troponin elevation -hx this month of abnormal nuc study with cath recommended through [facility]. Pt was too anxious to have done quickly.  I reviewed and Dr. Jerilynn Mages. Hendy Brindle reviewed with  and he is prepared for cath.  He has been NPO.  Continue Imdur, difficult with BB with recent cocaine use.  2.  CKD-3, recent stopping of lisinopril and colchicine.  Will give IV fluids beginning now.  3.  Hx PAF due to cocaine use.  Converted spontaneously.  4.  HTN.  Stable today   5. HLD on high dose lipitor  6. Gout   7. Schizoaffective disorder, bipolar  Cecilie Kicks  Nurse Practitioner Certified [place] Pager [phone] or after 5pm or weekends call [phone] 06/27/2016, 8:05 AM  I have seen and examined the patient along with Cecilie Kicks NP.  I have reviewed the chart, notes and new data.  I agree with PA/NP's note.  Key new complaints: currently without clear angina (a little tender to touch in precordial area, different from previous discomfort) Key examination changes: obese, no overt CHF, no arrhythmia Key new findings / data: creat 1.8 which appears to be his baseline  PLAN: He firmly promises never to use cocaine again. We ill see if this is a true commitment over time, but told him in no uncertain terms that this is a lethal habit in a patient with CAD. 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 This has not yet been evaluated with cath. Start IV fluids, try to get on schedule this afternoon. This procedure has been fully reviewed with the patient and written informed consent has been obtained. Benefits and risks discussed in detail, including risk of nephrotoxicity, which might lead to a "staged" PCI.   Sanda Klein, MD, [place] [phone] 06/27/2016, 9:14 AM

## 2016-06-27 NOTE — Interval H&P Note (Signed)
History and Physical Interval Note:  06/27/2016 1:25 PM  Xavier RenshawJoseph L Hedgecock  has presented today for cardiac catheterization, with the diagnosis of unstable angina  The various methods of treatment have been discussed with the patient and family. After consideration of risks, benefits and other options for treatment, the patient has consented to  Procedure(s): Left Heart Cath and Coronary Angiography (N/A) as a surgical intervention .  The patient's history has been reviewed, patient examined, no change in status, stable for surgery.  I have reviewed the patient's chart and labs.  Questions were answered to the patient's satisfaction.    Cath Lab Visit (complete for each Cath Lab visit)  Clinical Evaluation Leading to the Procedure:   ACS: Yes.    Non-ACS:  N/A  Oasis Goehring

## 2016-06-27 NOTE — Consult Note (Signed)
Reason for Consult: chest pain   Referring Physician: Dr.  Loleta Books  PCP:  No PCP Per Patient  Primary Cardiologist:new--Dr. Oliva Montecalvo  Xavier Ford is an 61 y.o. male.    Chief Complaint: admitted last pm with Lt sided chest pain and SOB.     HPI:   We have been asked to evaluate 19 yoM for chest pain.  Hx of A fib in computer- occurred with cocaine use and converted spontaneously and HTN.  Lisinopril stopped for elevated Cr.    + substance abuse and used cocaine last week.  Lives in rehab home with drug screen weekly.  Pt seen 06/25/16 for gout pain and then last night presented with chest pain.   He was seen this month at [facility] and had stress test on 06/16/16 'Inferoapical and lateral wall reperfusion suggesting ischemia. The left ventricular ejection fraction is calculated to be 60%. Normal wall motion."  Echo 06/16/16 Interpretation Summary A complete two-dimensional transthoracic echocardiogram with color flow Doppler and spectral Doppler was performed. The study was technically adequate. The left ventricle is grossly normal size. There is moderate concentric left ventricular hypertrophy. The left ventricular ejection fraction is normal (55-60%). The left ventricular wall motion is normal. The aortic valve is trileaflet. Mild aortic sclerosis is present with good valvular opening. Grade I mild diastolic dysfunction; abnormal relaxation pattern. Estimation of right ventricular systolic pressure is not possible.   They recommended cardiac cath but he was too afraid to have done.  EKG at that time with lateral ischemia.  On CT scan at [place] he had coronary calcifications- heavy burden.    He woke yesterday with cold type symptoms then once he got up and moved around he had chest pain.  Currently minimal pain.    Troponin poc at 0.04, tropoin I 0.04.    Cr. 1.87 BUN 22 CBC wnl.   EKG  SR LAD CXR with mild cardiomegaly no CHF.    Past Medical History:    Diagnosis Date  . Atrial fibrillation ([facility])   . CKD (chronic kidney disease), stage III   . Gout   . Hypertension   . Schizoaffective disorder [facility])     Past Surgical History:  Procedure Laterality Date  . Left finger pinning    . ORIF left radius      Family History  Problem Relation Age of Onset  . Hyperlipidemia Maternal Grandmother    Social History:  reports that he has never smoked. He has never used smokeless tobacco. He reports that he uses drugs, including Cocaine. He reports that he does not drink alcohol.  Allergies:  Allergies  Allergen Reactions  . Nsaids Hives  . Gabapentin     Other reaction(s): Cough (ALLERGY/intolerance), GI Upset (intolerance) Patient states after taking the medication, his throat started swelling, he had a horrible headache and abd cramping.  . Morphine And Related Itching    OUTPATIENT MEDICATIONS: No current facility-administered medications on file prior to encounter.    Current Outpatient Prescriptions on File Prior to Encounter  Medication Sig Dispense Refill  . oxyCODONE-acetaminophen (PERCOCET/ROXICET) 5-325 MG tablet Take 1 tablet by mouth every 4 (four) hours as needed for severe pain. 18 tablet 0  . predniSONE (DELTASONE) 20 MG tablet Take 3 tablets (60 mg total) by mouth daily. 15 tablet 0   CURRENT MEDICATIONS: Scheduled Meds: . allopurinol  300 mg Oral Daily  . aspirin  325 mg Oral Daily  .  atorvastatin  80 mg Oral Daily  . benztropine  1 mg Oral BID  . enoxaparin (LOVENOX) injection  40 mg Subcutaneous Q24H  . gabapentin  300 mg Oral TID  . isosorbide mononitrate  30 mg Oral Daily  . predniSONE  60 mg Oral Daily   Continuous Infusions: PRN Meds:.acetaminophen, gi cocktail, ondansetron (ZOFRAN) IV, traMADol   Results for orders placed or performed during the hospital encounter of 06/27/16 (from the past 48 hour(s))  Basic metabolic panel     Status: Abnormal   Collection Time: 06/26/16 11:19 PM  Result Value  Ref Range   Sodium 136 135 - 145 mmol/L   Potassium 3.6 3.5 - 5.1 mmol/L   Chloride 102 101 - 111 mmol/L   CO2 23 22 - 32 mmol/L   Glucose, Bld 123 (H) 65 - 99 mg/dL   BUN 22 (H) 6 - 20 mg/dL   Creatinine, Ser 1.87 (H) 0.61 - 1.24 mg/dL   Calcium 9.7 8.9 - 10.3 mg/dL   GFR calc non Af Amer 37 (L) >60 mL/min   GFR calc Af Amer 43 (L) >60 mL/min    Comment: (NOTE) The eGFR has been calculated using the CKD EPI equation. This calculation has not been validated in all clinical situations. eGFR's persistently <60 mL/min signify possible Chronic Kidney Disease.    Anion gap 11 5 - 15  CBC     Status: None   Collection Time: 06/26/16 11:19 PM  Result Value Ref Range   WBC 6.3 4.0 - 10.5 K/uL   RBC 4.87 4.22 - 5.81 MIL/uL   Hemoglobin 13.1 13.0 - 17.0 g/dL   HCT 40.3 39.0 - 52.0 %   MCV 82.8 78.0 - 100.0 fL   MCH 26.9 26.0 - 34.0 pg   MCHC 32.5 30.0 - 36.0 g/dL   RDW 15.2 11.5 - 15.5 %   Platelets 328 150 - 400 K/uL  I-stat troponin, ED     Status: None   Collection Time: 06/26/16 11:41 PM  Result Value Ref Range   Troponin i, poc 0.04 0.00 - 0.08 ng/mL   Comment 3            Comment: Due to the release kinetics of cTnI, a negative result within the first hours of the onset of symptoms does not rule out myocardial infarction with certainty. If myocardial infarction is still suspected, repeat the test at appropriate intervals.   Troponin I-serum (0, 3, 6 hours)     Status: Abnormal   Collection Time: 06/27/16  5:31 AM  Result Value Ref Range   Troponin I 0.03 (HH) <0.03 ng/mL    Comment: CRITICAL RESULT CALLED TO, READ BACK BY AND VERIFIED WITH: PHILLIPS K,RN 06/27/16 8295 [facility]    Dg Chest 2 View  Result Date: 06/27/2016 CLINICAL DATA:  Chest pain and shortness of breath EXAM: CHEST  2 VIEW COMPARISON:  None. FINDINGS: Low lung volumes. Limited lateral view due to patient's overlying soft tissues. No pleural effusion. Streaky atelectasis at the left base and lingula. Mild  cardiomegaly. No pneumothorax. IMPRESSION: 1. Mild cardiomegaly without overt failure 2. Streaky atelectasis at the left lung base. Electronically Signed   By: Donavan Foil M.D.   On: 06/27/2016 00:11    ROS: General:+ colds symptoms yesterday, no fevers, no weight changes Skin:no rashes or ulcers HEENT:no blurred vision, no congestion CV:see HPI PUL:see HPI GI:no diarrhea constipation or melena, no indigestion GU:no hematuria, no dysuria MS:no joint pain, no claudication, + gout Neuro:no  syncope, no lightheadedness Endo:no diabetes, no thyroid disease Psych:  Stable today, but helps to explain slowly he becomes anxious.   Blood pressure 133/77, pulse 64, temperature 98.7 F (37.1 C), temperature source Oral, resp. rate (!) 23, height 6' (1.829 m), weight 243 lb 9.7 oz (110.5 kg), SpO2 98 %.  Wt Readings from Last 3 Encounters:  06/27/16 243 lb 9.7 oz (110.5 kg)  06/25/16 240 lb (108.9 kg)    PE: General:Pleasant affect, NAD Skin:Warm and dry, brisk capillary refill HEENT:normocephalic, sclera clear, mucus membranes moist Neck:supple, no JVD, no bruits  Heart:S1S2 RRR without murmur, gallup, rub or click Lungs:clear without rales, rhonchi, or wheezes FUQ:XAFH, non tender, + BS, do not palpate liver spleen or masses Ext:no lower ext edema, 2+ pedal pulses, 2+ radial pulses Neuro:alert and oriented X 3, MAE, follows commands, + facial symmetry Psych:  Mildly anxious about any procedure -as above pleasant affect.    Assessment/Plan Principal Problem:   Chest pain Active Problems:   CKD (chronic kidney disease) stage 3, GFR 30-59 ml/min   Gout   Hypertension   Polysubstance abuse   Schizoaffective disorder, bipolar type (HCC)   AF (paroxysmal atrial fibrillation) ([place])  1. Chest pain with mild troponin elevation -hx this month of abnormal nuc study with cath recommended through [facility]. Pt was too anxious to have done quickly.  I reviewed and Dr. Jerilynn Mages. Dellene Mcgroarty reviewed with  and he is prepared for cath.  He has been NPO.  Continue Imdur, difficult with BB with recent cocaine use.  2.  CKD-3, recent stopping of lisinopril and colchicine.  Will give IV fluids beginning now.  3.  Hx PAF due to cocaine use.  Converted spontaneously.  4.  HTN.  Stable today   5. HLD on high dose lipitor  6. Gout   7. Schizoaffective disorder, bipolar  Cecilie Kicks  Nurse Practitioner Certified [place] Pager [phone] or after 5pm or weekends call [phone] 06/27/2016, 8:05 AM  I have seen and examined the patient along with Cecilie Kicks NP.  I have reviewed the chart, notes and new data.  I agree with PA/NP's note.  Key new complaints: currently without clear angina (a little tender to touch in precordial area, different from previous discomfort) Key examination changes: obese, no overt CHF, no arrhythmia Key new findings / data: creat 1.8 which appears to be his baseline  PLAN: He firmly promises never to use cocaine again. We ill see if this is a true commitment over time, but told him in no uncertain terms that this is a lethal habit in a patient with CAD. 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 This has not yet been evaluated with cath. Start IV fluids, try to get on schedule this afternoon. This procedure has been fully reviewed with the patient and written informed consent has been obtained. Benefits and risks discussed in detail, including risk of nephrotoxicity, which might lead to a "staged" PCI.   Sanda Klein, MD, [place] [phone] 06/27/2016, 9:14 AM

## 2016-06-27 NOTE — ED Notes (Signed)
Inpatient doc rounding at bedside

## 2016-06-28 DIAGNOSIS — R072 Precordial pain: Secondary | ICD-10-CM | POA: Diagnosis not present

## 2016-06-28 DIAGNOSIS — R0789 Other chest pain: Secondary | ICD-10-CM | POA: Diagnosis not present

## 2016-06-28 DIAGNOSIS — Z9119 Patient's noncompliance with other medical treatment and regimen: Secondary | ICD-10-CM | POA: Diagnosis not present

## 2016-06-28 DIAGNOSIS — N183 Chronic kidney disease, stage 3 (moderate): Secondary | ICD-10-CM | POA: Diagnosis present

## 2016-06-28 DIAGNOSIS — I483 Typical atrial flutter: Secondary | ICD-10-CM | POA: Diagnosis not present

## 2016-06-28 DIAGNOSIS — E669 Obesity, unspecified: Secondary | ICD-10-CM | POA: Diagnosis present

## 2016-06-28 DIAGNOSIS — I4892 Unspecified atrial flutter: Secondary | ICD-10-CM | POA: Diagnosis present

## 2016-06-28 DIAGNOSIS — F250 Schizoaffective disorder, bipolar type: Secondary | ICD-10-CM | POA: Diagnosis present

## 2016-06-28 DIAGNOSIS — F1410 Cocaine abuse, uncomplicated: Secondary | ICD-10-CM | POA: Diagnosis present

## 2016-06-28 DIAGNOSIS — I25110 Atherosclerotic heart disease of native coronary artery with unstable angina pectoris: Secondary | ICD-10-CM | POA: Diagnosis present

## 2016-06-28 DIAGNOSIS — I129 Hypertensive chronic kidney disease with stage 1 through stage 4 chronic kidney disease, or unspecified chronic kidney disease: Secondary | ICD-10-CM | POA: Diagnosis present

## 2016-06-28 DIAGNOSIS — E785 Hyperlipidemia, unspecified: Secondary | ICD-10-CM | POA: Diagnosis present

## 2016-06-28 DIAGNOSIS — I10 Essential (primary) hypertension: Secondary | ICD-10-CM | POA: Diagnosis not present

## 2016-06-28 DIAGNOSIS — M109 Gout, unspecified: Secondary | ICD-10-CM | POA: Diagnosis present

## 2016-06-28 DIAGNOSIS — I700 Atherosclerosis of aorta: Secondary | ICD-10-CM | POA: Diagnosis present

## 2016-06-28 DIAGNOSIS — Z79899 Other long term (current) drug therapy: Secondary | ICD-10-CM | POA: Diagnosis not present

## 2016-06-28 DIAGNOSIS — G8929 Other chronic pain: Secondary | ICD-10-CM | POA: Diagnosis present

## 2016-06-28 DIAGNOSIS — Z7982 Long term (current) use of aspirin: Secondary | ICD-10-CM | POA: Diagnosis not present

## 2016-06-28 DIAGNOSIS — I480 Paroxysmal atrial fibrillation: Secondary | ICD-10-CM | POA: Diagnosis present

## 2016-06-28 LAB — LIPID PANEL
Cholesterol: 165 mg/dL (ref 0–200)
HDL: 44 mg/dL (ref >40)
LDL CALC: 105 mg/dL — AB (ref 0–99)
Total CHOL/HDL Ratio: 3.8 RATIO
Triglycerides: 78 mg/dL (ref <150)
VLDL: 16 mg/dL (ref 0–40)

## 2016-06-28 LAB — HIV ANTIBODY (ROUTINE TESTING W REFLEX): HIV SCREEN 4TH GENERATION: NONREACTIVE

## 2016-06-28 MED ORDER — DILTIAZEM HCL 25 MG/5ML IV SOLN
5.0000 mg | Freq: Once | INTRAVENOUS | Status: AC
  Administered 2016-06-28: 5 mg via INTRAVENOUS
  Filled 2016-06-28: qty 5

## 2016-06-28 MED ORDER — OXYCODONE-ACETAMINOPHEN 5-325 MG PO TABS
1.0000 | ORAL_TABLET | Freq: Four times a day (QID) | ORAL | Status: DC | PRN
  Administered 2016-06-28 – 2016-06-29 (×5): 1 via ORAL
  Filled 2016-06-28 (×5): qty 1

## 2016-06-28 MED ORDER — DILTIAZEM HCL-DEXTROSE 100-5 MG/100ML-% IV SOLN (PREMIX)
5.0000 mg/h | INTRAVENOUS | Status: DC
  Administered 2016-06-28 – 2016-06-29 (×2): 5 mg/h via INTRAVENOUS
  Filled 2016-06-28 (×2): qty 100

## 2016-06-28 MED ORDER — DOCUSATE SODIUM 100 MG PO CAPS
200.0000 mg | ORAL_CAPSULE | Freq: Once | ORAL | Status: AC
  Administered 2016-06-28: 200 mg via ORAL
  Filled 2016-06-28: qty 2

## 2016-06-28 MED ORDER — APIXABAN 5 MG PO TABS
5.0000 mg | ORAL_TABLET | Freq: Two times a day (BID) | ORAL | Status: DC
  Administered 2016-06-28 – 2016-06-30 (×5): 5 mg via ORAL
  Filled 2016-06-28 (×5): qty 1

## 2016-06-28 MED ORDER — DOCUSATE SODIUM 100 MG PO CAPS
100.0000 mg | ORAL_CAPSULE | Freq: Two times a day (BID) | ORAL | Status: DC
  Administered 2016-06-29 – 2016-06-30 (×3): 100 mg via ORAL
  Filled 2016-06-28 (×4): qty 1

## 2016-06-28 NOTE — Progress Notes (Signed)
Patient ID: Xavier Ford, male   DOB: 1955-10-26, 61 y.o.   MRN: [id number]  PROGRESS NOTE    Xavier Ford  TDD:[id number] DOB: 1955-10-26 DOA: 06/27/2016  PCP: No PCP Per Patient   Brief Narrative:  61 year old male with past medical history significant for polysubstance abuse including cocaine, atrial fibrillation and not on anticoagulation due to noncompliance, schizoaffective disorder, chronic kidney disease stage III with baseline creatinine of 1.6. Patient presented with chest pain lasting for last day or so prior to this admission, associated diaphoresis, shortness of breath and fatigue. He was admitted to [facility] at the beginning of this month for the same problem. He underwent Cadriolite there that showed "Inferoapical and lateral wall reperfusion suggesting ischemia. Ejection fraction was calculated to be 60% and a normal wall motion. He was offered to have a left heart catheterization but left AGAINST MEDICAL ADVICE.  [facility] this [facility]. He was found to have large, ectatic coronary arteries, mild to moderate, diffuse LAD disease of up to 50%, normal left ventricular filling pressure. Recommendation was to continue medical management.  Assessment & Plan:   Principal Problem:   Chest pain / troponin level elevation - Status post cardiac cath 06/27/2016 with findings of large, ectatic coronary arteries, mild to moderate, diffuse LAD disease of up to 50%, normal left ventricular filling pressure. - Recommendation was to continue medical management - Continue apixaban  - Continue aspirin, Lipitor 80 mg daily   Active Problems:   A flutter - Pt with HR in 140's even after Cardizem 5 mg IV push - Obtain 12 lead  - Cardiology recommended Cardizem drip - Continue to monitor on telemetry    CKD (chronic kidney disease) stage 3, GFR 30-59 ml/min - Baseline Cr 1.6 - Cr slightly above baseline value, 1.8 ont his  admission - Continue to monitor BMP daily     Gout - Continue allopurinol    Essential hypertension - Continue isosorbide mononitrate    Polysubstance abuse, cocaine abuse - Counseled on cessation    Schizoaffective disorder, bipolar type (HCC) - Continue Cogentin 1 mg twice daily    AF (paroxysmal atrial fibrillation) (HCC) - CHADS vasc score 2 - Not on AC due to non compliance - Needs to be on Cardizem drip due to a flutter this am    DVT prophylaxis: Lovenox subQ Code Status: full code  Family Communication: no family at the bedside this am Disposition Plan: home once cleared by cardio    Consultants:   Cardio   Procedures:   Cardiac cath 06/27/2016 with findings of large, ectatic coronary arteries, mild to moderate, diffuse LAD disease of up to 50%, normal left ventricular filling pressure.  Antimicrobials:   None     Subjective: No overnight events. Tachycardic this am.   Objective: Vitals:   06/28/16 0700 06/28/16 0800 06/28/16 0819 06/28/16 1000  BP: 123/80 (!) 135/101 (!) 135/101 (!) 157/97  Pulse: 66 74 73 (!) 114  Resp: 20 (!) 23 (!) 21 (!) 24  Temp:   98.1 F (36.7 C)   TempSrc:   Oral   SpO2:   98%   Weight:      Height:        Intake/Output Summary (Last 24 hours) at 06/28/16 1031 Last data filed at 06/28/16 0818  Gross per 24 hour  Intake           3131.8 ml  Output  1000 ml  Net           2131.8 ml   Filed Weights   06/27/16 1610  Weight: 110.5 kg (243 lb 9.7 oz)    Examination:  General exam: Appears calm and comfortable  Respiratory system: Clear to auscultation. Respiratory effort normal. Cardiovascular system: S1 & S2 heard, tachycardic  Gastrointestinal system: Abdomen is nondistended, soft and nontender. No organomegaly or masses felt. Normal bowel sounds heard. Central nervous system: Alert and oriented. No focal neurological deficits. Extremities: Symmetric 5 x 5 power. Skin: No rashes, lesions or  ulcers Psychiatry: Judgement and insight appear normal. Mood & affect appropriate.   Data Reviewed: I have personally reviewed following labs and imaging studies  CBC:  Recent Labs Lab 06/26/16 2319  WBC 6.3  HGB 13.1  HCT 40.3  MCV 82.8  PLT 328   Basic Metabolic Panel:  Recent Labs Lab 06/26/16 2319 06/27/16 1057  NA 136 138  K 3.6 4.1  CL 102 105  CO2 23 24  GLUCOSE 123* 100*  BUN 22* 24*  CREATININE 1.87* 1.80*  CALCIUM 9.7 9.0   GFR: Estimated Creatinine Clearance: 56 mL/min (A) (by C-G formula based on SCr of 1.8 mg/dL (H)). Liver Function Tests: No results for input(s): AST, ALT, ALKPHOS, BILITOT, PROT, ALBUMIN in the last 168 hours. No results for input(s): LIPASE, AMYLASE in the last 168 hours. No results for input(s): AMMONIA in the last 168 hours. Coagulation Profile:  Recent Labs Lab 06/27/16 1057  INR 1.02   Cardiac Enzymes:  Recent Labs Lab 06/27/16 0531 06/27/16 0905  TROPONINI 0.03* 0.03*   BNP (last 3 results) No results for input(s): PROBNP in the last 8760 hours. HbA1C: No results for input(s): HGBA1C in the last 72 hours. CBG: No results for input(s): GLUCAP in the last 168 hours. Lipid Profile: No results for input(s): CHOL, HDL, LDLCALC, TRIG, CHOLHDL, LDLDIRECT in the last 72 hours. Thyroid Function Tests: No results for input(s): TSH, T4TOTAL, FREET4, T3FREE, THYROIDAB in the last 72 hours. Anemia Panel: No results for input(s): VITAMINB12, FOLATE, FERRITIN, TIBC, IRON, RETICCTPCT in the last 72 hours. Urine analysis: No results found for: COLORURINE, APPEARANCEUR, LABSPEC, PHURINE, GLUCOSEU, HGBUR, BILIRUBINUR, KETONESUR, PROTEINUR, UROBILINOGEN, NITRITE, LEUKOCYTESUR Sepsis Labs: @LABRCNTIP (procalcitonin:4,lacticidven:4)    MRSA PCR Screening     Status: None   Collection Time: 06/27/16  6:48 AM  Result Value Ref Range Status   MRSA by PCR NEGATIVE NEGATIVE Final      Radiology Studies: Dg Chest 2 View Result  Date: 06/27/2016  1. Mild cardiomegaly without overt failure 2. Streaky atelectasis at the left lung base. Electronically Signed   By: Jasmine Pang M.D.   On: 06/27/2016 00:11     Scheduled Meds: . allopurinol  300 mg Oral Daily  . aspirin  81 mg Oral Daily  . atorvastatin  80 mg Oral Daily  . benztropine  1 mg Oral BID  . enoxaparin (LOVENOX) injection  40 mg Subcutaneous Q24H  . gabapentin  300 mg Oral TID  . isosorbide mononitrate  30 mg Oral Daily  . predniSONE  60 mg Oral Daily  . sodium chloride flush  3 mL Intravenous Q12H   Continuous Infusions:   LOS: 0 days    Time spent: 25 minutes  Greater than 50% of the time spent on counseling and coordinating the care.   Manson Passey, MD [facility] Pager [phone]  If 7PM-7AM, please contact night-coverage [email or web address] Password [facility] 06/28/2016, 10:31 AM

## 2016-06-28 NOTE — Progress Notes (Signed)
Pt HR back into 150s. MD paged and ordered another 5 mg of Cardizem and another EKG.

## 2016-06-28 NOTE — Progress Notes (Signed)
Pt last BM 3/15. Pt requesting colace stated he normally takes at home. Md paged. Colace ordered 200 mg once and 100 mg BID. Will continue to monitor.

## 2016-06-28 NOTE — Progress Notes (Addendum)
Progress Note  Patient Name: Xavier Ford Date of Encounter: 06/28/2016  Primary Cardiologist: Croitoru  Subjective   61 yo with CP , atrial fib ( with cocaine use)  HTN  CP with +   cocaine last week Cath on 3/16 shows mild - mod CAD  He is back in atrial flutter this morning. Attempted carotid sinus massage but the HR did not slow   Inpatient Medications    Scheduled Meds: . allopurinol  300 mg Oral Daily  . aspirin  81 mg Oral Daily  . atorvastatin  80 mg Oral Daily  . benztropine  1 mg Oral BID  . diltiazem  5 mg Intravenous Once  . enoxaparin (LOVENOX) injection  40 mg Subcutaneous Q24H  . gabapentin  300 mg Oral TID  . isosorbide mononitrate  30 mg Oral Daily  . predniSONE  60 mg Oral Daily  . sodium chloride flush  3 mL Intravenous Q12H   Continuous Infusions:  PRN Meds: sodium chloride, acetaminophen, gi cocktail, ondansetron (ZOFRAN) IV, sodium chloride flush, traMADol   Vital Signs    Vitals:   06/28/16 0700 06/28/16 0800 06/28/16 0819 06/28/16 1000  BP: 123/80 (!) 135/101 (!) 135/101 (!) 157/97  Pulse: 66 74 73 (!) 114  Resp: 20 (!) 23 (!) 21 (!) 24  Temp:   98.1 F (36.7 C)   TempSrc:   Oral   SpO2:   98%   Weight:      Height:        Intake/Output Summary (Last 24 hours) at 06/28/16 1037 Last data filed at 06/28/16 0818  Gross per 24 hour  Intake           3131.8 ml  Output             1000 ml  Net           2131.8 ml   Filed Weights   06/27/16 0627  Weight: 243 lb 9.7 oz (110.5 kg)    Telemetry    Atrial flutter with a heart rate of 150  - Personally Reviewed  ECG    Atrial flutter with 2 to one conduction. Heart rate is 150.  - Personally Reviewed  Physical Exam   GEN: No acute distress.   Neck: No JVD Cardiac: RRR, He is tachycardic.   no murmurs, rubs, or gallops.  Respiratory: Clear to auscultation bilaterally. GI: Soft, nontender, non-distended  MS: No edema; No deformity. Neuro:  Nonfocal  Psych: Normal affect     Labs    Chemistry Recent Labs Lab 06/26/16 2319 06/27/16 1057  NA 136 138  K 3.6 4.1  CL 102 105  CO2 23 24  GLUCOSE 123* 100*  BUN 22* 24*  CREATININE 1.87* 1.80*  CALCIUM 9.7 9.0  GFRNONAA 37* 39*  GFRAA 43* 45*  ANIONGAP 11 9     Hematology Recent Labs Lab 06/26/16 2319  WBC 6.3  RBC 4.87  HGB 13.1  HCT 40.3  MCV 82.8  MCH 26.9  MCHC 32.5  RDW 15.2  PLT 328    Cardiac Enzymes Recent Labs Lab 06/27/16 0531 06/27/16 0905  TROPONINI 0.03* 0.03*    Recent Labs Lab 06/26/16 2341  TROPIPOC 0.04     BNPNo results for input(s): BNP, PROBNP in the last 168 hours.   DDimer No results for input(s): DDIMER in the last 168 hours.   Radiology    Dg Chest 2 View  Result Date: 06/27/2016 CLINICAL DATA:  Chest pain and  shortness of breath EXAM: CHEST  2 VIEW COMPARISON:  None. FINDINGS: Low lung volumes. Limited lateral view due to patient's overlying soft tissues. No pleural effusion. Streaky atelectasis at the left base and lingula. Mild cardiomegaly. No pneumothorax. IMPRESSION: 1. Mild cardiomegaly without overt failure 2. Streaky atelectasis at the left lung base. Electronically Signed   By: Jasmine Pang M.D.   On: 06/27/2016 00:11    Cardiac Studies     Patient Profile     61 y.o. male  is admitted with chest pain and a positive UDS for cocaine. Cardiac catheterization reveals moderate coronary artery disease.  He now is gone back into atrial flutter.  Assessment & Plan   1. Coronary artery disease: The patient presented with chest pains and minimal troponin elevations. Cardiac catheter station reveals ectatic coronary arteries as well as moderate disease involving the LAD and diagonal system. We've stressed to him the importance of avoiding cocaine. He does not need any intervention but will need aggressive medical therapy. We'll check a lipid profile  2. Atrial flutter:   CHADS2VASC is  2   ( HTN, CAD, )  He is back in atrial flutter.   I  attempted carotid sinus massage but he did not slow down.   A Cardizem bolus has been given and we will also order a Cardizem drip. Given his chads score of 2, he should be on oral anticoagulation. We'll start  Eliquis  5 mg twice a day  3. Hypertension: Continue current medications. He will also need to be started on Cardizem or metoprolol for rate control of his atrial flutter.  Signed, Kristeen Miss, MD  06/28/2016, 10:37 AM

## 2016-06-29 DIAGNOSIS — F250 Schizoaffective disorder, bipolar type: Secondary | ICD-10-CM

## 2016-06-29 LAB — CBC
HEMATOCRIT: 36 % — AB (ref 39.0–52.0)
HEMOGLOBIN: 11.5 g/dL — AB (ref 13.0–17.0)
MCH: 26.9 pg (ref 26.0–34.0)
MCHC: 31.9 g/dL (ref 30.0–36.0)
MCV: 84.3 fL (ref 78.0–100.0)
Platelets: 325 10*3/uL (ref 150–400)
RBC: 4.27 MIL/uL (ref 4.22–5.81)
RDW: 15.4 % (ref 11.5–15.5)
WBC: 11.9 10*3/uL — AB (ref 4.0–10.5)

## 2016-06-29 LAB — BASIC METABOLIC PANEL
ANION GAP: 7 (ref 5–15)
BUN: 18 mg/dL (ref 6–20)
CALCIUM: 8.9 mg/dL (ref 8.9–10.3)
CHLORIDE: 106 mmol/L (ref 101–111)
CO2: 25 mmol/L (ref 22–32)
CREATININE: 1.67 mg/dL — AB (ref 0.61–1.24)
GFR calc non Af Amer: 43 mL/min — ABNORMAL LOW (ref >60)
GFR, EST AFRICAN AMERICAN: 50 mL/min — AB (ref >60)
Glucose, Bld: 131 mg/dL — ABNORMAL HIGH (ref 65–99)
Potassium: 4.5 mmol/L (ref 3.5–5.1)
SODIUM: 138 mmol/L (ref 135–145)

## 2016-06-29 MED ORDER — ZOLPIDEM TARTRATE 5 MG PO TABS
10.0000 mg | ORAL_TABLET | Freq: Every evening | ORAL | Status: DC | PRN
  Administered 2016-06-29: 10 mg via ORAL
  Filled 2016-06-29: qty 2

## 2016-06-29 MED ORDER — DILTIAZEM HCL ER COATED BEADS 240 MG PO CP24
240.0000 mg | ORAL_CAPSULE | Freq: Every day | ORAL | Status: DC
  Administered 2016-06-29 – 2016-06-30 (×2): 240 mg via ORAL
  Filled 2016-06-29 (×2): qty 1

## 2016-06-29 NOTE — Progress Notes (Signed)
Patient ID: Xavier Ford, male   DOB: 1955-10-09, 61 y.o.   MRN: [id number]  PROGRESS NOTE    Xavier Ford  UXL:[id number] DOB: 1955-10-09 DOA: 06/27/2016  PCP: No PCP Per Patient   Brief Narrative:  61 year old male with past medical history significant for polysubstance abuse including cocaine, atrial fibrillation and not on anticoagulation due to noncompliance, schizoaffective disorder, chronic kidney disease stage III with baseline creatinine of 1.6. Patient presented with chest pain lasting for last day or so prior to this admission, associated diaphoresis, shortness of breath and fatigue. He was admitted to [facility] at the beginning of this month for the same problem. He underwent Cadriolite there that showed "Inferoapical and lateral wall reperfusion suggesting ischemia. Ejection fraction was calculated to be 60% and a normal wall motion. He was offered to have a left heart catheterization but left AGAINST MEDICAL ADVICE.  [facility] during this [facility]. He was found to have large, ectatic coronary arteries, mild to moderate, diffuse LAD disease of up to 50%, normal left ventricular filling pressure. Recommendation is to continue medical management.  Assessment & Plan:   Principal Problem:   Chest pain / troponin level elevation - Status post cardiac cath 06/27/2016 with findings of large, ectatic coronary arteries, mild to moderate, diffuse LAD disease of up to 50%, normal left ventricular filling pressure. - Continue medical management per cardio  - Continue apixaban  - Continue aspirin, Lipitor 80 mg daily   Active Problems:   CKD (chronic kidney disease) stage 3, GFR 30-59 ml/min - Baseline Cr 1.6 - Cr is now within baseline range     Gout - Continue allopurinol    Essential hypertension - Continue isosorbide mononitrate    Polysubstance abuse, cocaine abuse - Counseled on cessation    Schizoaffective  disorder, bipolar type (HCC) - Continue Cogentin 1 mg twice daily    AF (paroxysmal atrial fibrillation) (HCC) / A flutter  - CHADS vasc score 2 - Due to HR in 140'2 on 3/17, Cardizem drip started; cardio switch to Cardizem 240 mg daily - Apixaban started for anticoagulation today    DVT prophylaxis: Apixaban Code Status: full code  Family Communication: no family at the bedside this am Disposition Plan: home once cleared by cardio    Consultants:   Cardio   Procedures:   Cardiac cath 06/27/2016 with findings of large, ectatic coronary arteries, mild to moderate, diffuse LAD disease of up to 50%, normal left ventricular filling pressure.  Antimicrobials:   None     Subjective: No overnight events.   Objective: Vitals:   06/29/16 0316 06/29/16 0318 06/29/16 0800 06/29/16 0900  BP:   (!) 159/87 (!) 180/92  Pulse: 61 (!) 104 (!) 53 92  Resp: 16 (!) 21 20 19   Temp:   98.1 F (36.7 C)   TempSrc:   Oral   SpO2: 97% 95% 100% 100%  Weight:      Height:        Intake/Output Summary (Last 24 hours) at 06/29/16 1106 Last data filed at 06/29/16 0900  Gross per 24 hour  Intake           924.83 ml  Output             2950 ml  Net         -2025.17 ml   Filed Weights   06/27/16 0627  Weight: 110.5 kg (243 lb 9.7 oz)    Examination:  General  exam: Appears calm and comfortable, no distress  Respiratory system: No wheezing, no rhonchi  Cardiovascular system: S1 & S2 heard, tachycardic  Gastrointestinal system: (+) BS, non tender  Central nervous system: No focal neurological deficits. Extremities: No swelling, palpable pulses  Skin: warm, dry  Psychiatry: no agitation, no restlessness   Data Reviewed: I have personally reviewed following labs and imaging studies  CBC:  Recent Labs Lab 06/26/16 2319 06/29/16 0237  WBC 6.3 11.9*  HGB 13.1 11.5*  HCT 40.3 36.0*  MCV 82.8 84.3  PLT 328 325   Basic Metabolic Panel:  Recent Labs Lab 06/26/16 2319  06/27/16 1057 06/29/16 0237  NA 136 138 138  K 3.6 4.1 4.5  CL 102 105 106  CO2 23 24 25   GLUCOSE 123* 100* 131*  BUN 22* 24* 18  CREATININE 1.87* 1.80* 1.67*  CALCIUM 9.7 9.0 8.9   GFR: Estimated Creatinine Clearance: 60.4 mL/min (A) (by C-G formula based on SCr of 1.67 mg/dL (H)). Liver Function Tests: No results for input(s): AST, ALT, ALKPHOS, BILITOT, PROT, ALBUMIN in the last 168 hours. No results for input(s): LIPASE, AMYLASE in the last 168 hours. No results for input(s): AMMONIA in the last 168 hours. Coagulation Profile:  Recent Labs Lab 06/27/16 1057  INR 1.02   Cardiac Enzymes:  Recent Labs Lab 06/27/16 0531 06/27/16 0905  TROPONINI 0.03* 0.03*   BNP (last 3 results) No results for input(s): PROBNP in the last 8760 hours. HbA1C: No results for input(s): HGBA1C in the last 72 hours. CBG: No results for input(s): GLUCAP in the last 168 hours. Lipid Profile:  Recent Labs  06/28/16 1132  CHOL 165  HDL 44  LDLCALC 105*  TRIG 78  CHOLHDL 3.8   Thyroid Function Tests: No results for input(s): TSH, T4TOTAL, FREET4, T3FREE, THYROIDAB in the last 72 hours. Anemia Panel: No results for input(s): VITAMINB12, FOLATE, FERRITIN, TIBC, IRON, RETICCTPCT in the last 72 hours. Urine analysis: No results found for: COLORURINE, APPEARANCEUR, LABSPEC, PHURINE, GLUCOSEU, HGBUR, BILIRUBINUR, KETONESUR, PROTEINUR, UROBILINOGEN, NITRITE, LEUKOCYTESUR Sepsis Labs: @LABRCNTIP (procalcitonin:4,lacticidven:4)    MRSA PCR Screening     Status: None   Collection Time: 06/27/16  6:48 AM  Result Value Ref Range Status   MRSA by PCR NEGATIVE NEGATIVE Final      Radiology Studies: Dg Chest 2 View Result Date: 06/27/2016  1. Mild cardiomegaly without overt failure 2. Streaky atelectasis at the left lung base. Electronically Signed   By: Jasmine Pang M.D.   On: 06/27/2016 00:11     Scheduled Meds: . allopurinol  300 mg Oral Daily  . apixaban  5 mg Oral BID  .  aspirin  81 mg Oral Daily  . atorvastatin  80 mg Oral Daily  . benztropine  1 mg Oral BID  . diltiazem  240 mg Oral Daily  . docusate sodium  100 mg Oral BID  . gabapentin  300 mg Oral TID  . isosorbide mononitrate  30 mg Oral Daily  . sodium chloride flush  3 mL Intravenous Q12H   Continuous Infusions:   LOS: 1 day    Time spent: 25 minutes  Greater than 50% of the time spent on counseling and coordinating the care.   Manson Passey, MD [facility] Pager [phone]  If 7PM-7AM, please contact night-coverage [email or web address] Password [email or web address] 06/29/2016, 11:06 AM

## 2016-06-29 NOTE — Progress Notes (Signed)
Progress Note  Patient Name: Xavier RenshawJoseph L Ford Date of Encounter: 06/29/2016  Primary Cardiologist: Croitoru  Subjective   61 yo with CP , atrial fib ( with cocaine use)  HTN  CP with +   cocaine last week Cath on 3/16 shows mild - mod CAD   He developed atrial flutter yesterday    Inpatient Medications    Scheduled Meds: . allopurinol  300 mg Oral Daily  . apixaban  5 mg Oral BID  . aspirin  81 mg Oral Daily  . atorvastatin  80 mg Oral Daily  . benztropine  1 mg Oral BID  . docusate sodium  100 mg Oral BID  . gabapentin  300 mg Oral TID  . isosorbide mononitrate  30 mg Oral Daily  . sodium chloride flush  3 mL Intravenous Q12H   Continuous Infusions: . diltiazem (CARDIZEM) infusion 5 mg/hr (06/29/16 0607)   PRN Meds: sodium chloride, acetaminophen, gi cocktail, ondansetron (ZOFRAN) IV, oxyCODONE-acetaminophen, sodium chloride flush, traMADol   Vital Signs    Vitals:   06/29/16 0316 06/29/16 0318 06/29/16 0800 06/29/16 0900  BP:   (!) 159/87 (!) 180/92  Pulse: 61 (!) 104 (!) 53 92  Resp: 16 (!) 21 20 19   Temp:   98.1 F (36.7 C)   TempSrc:   Oral   SpO2: 97% 95% 100% 100%  Weight:      Height:        Intake/Output Summary (Last 24 hours) at 06/29/16 1024 Last data filed at 06/29/16 0900  Gross per 24 hour  Intake           924.83 ml  Output             2950 ml  Net         -2025.17 ml   Filed Weights   06/27/16 0627  Weight: 243 lb 9.7 oz (110.5 kg)    Telemetry    NSR at 80  - Personally Reviewed He converted from atrial flutter to NSR last night   ECG    NSR   - Personally Reviewed  Physical Exam   GEN: No acute distress.   Neck: No JVD Cardiac: RRR, He is tachycardic.   no murmurs, rubs, or gallops.  Respiratory: Clear to auscultation bilaterally. GI: Soft, nontender, non-distended  MS: No edema; No deformity. Neuro:  Nonfocal  Psych: Normal affect   Labs    Chemistry  Recent Labs Lab 06/26/16 2319 06/27/16 1057  06/29/16 0237  NA 136 138 138  K 3.6 4.1 4.5  CL 102 105 106  CO2 23 24 25   GLUCOSE 123* 100* 131*  BUN 22* 24* 18  CREATININE 1.87* 1.80* 1.67*  CALCIUM 9.7 9.0 8.9  GFRNONAA 37* 39* 43*  GFRAA 43* 45* 50*  ANIONGAP 11 9 7      Hematology  Recent Labs Lab 06/26/16 2319 06/29/16 0237  WBC 6.3 11.9*  RBC 4.87 4.27  HGB 13.1 11.5*  HCT 40.3 36.0*  MCV 82.8 84.3  MCH 26.9 26.9  MCHC 32.5 31.9  RDW 15.2 15.4  PLT 328 325    Cardiac Enzymes  Recent Labs Lab 06/27/16 0531 06/27/16 0905  TROPONINI 0.03* 0.03*     Recent Labs Lab 06/26/16 2341  TROPIPOC 0.04     BNPNo results for input(s): BNP, PROBNP in the last 168 hours.   DDimer No results for input(s): DDIMER in the last 168 hours.   Radiology    No results found.  Cardiac  Studies     Patient Profile     61 y.o. male  is admitted with chest pain and a positive UDS for cocaine. Cardiac catheterization reveals moderate coronary artery disease.  He now is gone back into atrial flutter.  Assessment & Plan   1. Coronary artery disease: The patient presented with chest pains and minimal troponin elevations. Cardiac catheter station reveals ectatic coronary arteries as well as moderate disease involving the LAD and diagonal system. We've stressed to him the importance of avoiding cocaine. He does not need any intervention but will need aggressive medical therapy.  lipid profile from 3/17  Lipid Panel     Component Value Date/Time   CHOL 165 06/28/2016 1132   TRIG 78 06/28/2016 1132   HDL 44 06/28/2016 1132   CHOLHDL 3.8 06/28/2016 1132   VLDL 16 06/28/2016 1132   LDLCALC 105 (H) 06/28/2016 1132   Continue atorvastatin    2. Atrial flutter:   CHADS2VASC is  2   ( HTN, CAD, )  He is back in atrial flutter.   I attempted carotid sinus massage but he did not slow down.   He has converted to NSR - currently on Dilt drip at 5 mg / hr. Will transition to PO Cardizem CD 240 mg a day  Given his chads  score of 2, he should be on oral anticoagulation. We'll start  Eliquis  5 mg twice a day  3. Hypertension: Continue current medications.    Signed, Kristeen Miss, MD  06/29/2016, 10:24 AM

## 2016-06-29 NOTE — Plan of Care (Signed)
Problem: Safety: Goal: Ability to remain free from injury will improve Outcome: Progressing Patient encouraged to call for help before getting up.  Bed alarm in place.

## 2016-06-29 NOTE — Plan of Care (Signed)
Problem: Bowel/Gastric: Goal: Will not experience complications related to bowel motility Outcome: Progressing Pt has not had BM since 3/15. Now receiving colace. Will continue to monitor.

## 2016-06-29 NOTE — Plan of Care (Signed)
Problem: Cardiovascular: Goal: Vascular access site(s) Level 0-1 will be maintained Outcome: Progressing No issue with right radial site. Level 0 since procedure. Radial pulse is +2

## 2016-06-30 ENCOUNTER — Encounter (HOSPITAL_COMMUNITY): Payer: Self-pay | Admitting: Internal Medicine

## 2016-06-30 DIAGNOSIS — R072 Precordial pain: Secondary | ICD-10-CM

## 2016-06-30 LAB — BASIC METABOLIC PANEL
ANION GAP: 7 (ref 5–15)
BUN: 17 mg/dL (ref 6–20)
CHLORIDE: 105 mmol/L (ref 101–111)
CO2: 23 mmol/L (ref 22–32)
Calcium: 9.1 mg/dL (ref 8.9–10.3)
Creatinine, Ser: 1.57 mg/dL — ABNORMAL HIGH (ref 0.61–1.24)
GFR calc Af Amer: 54 mL/min — ABNORMAL LOW (ref >60)
GFR calc non Af Amer: 46 mL/min — ABNORMAL LOW (ref >60)
Glucose, Bld: 171 mg/dL — ABNORMAL HIGH (ref 65–99)
Potassium: 4.3 mmol/L (ref 3.5–5.1)
Sodium: 135 mmol/L (ref 135–145)

## 2016-06-30 LAB — CBC
HEMATOCRIT: 37.2 % — AB (ref 39.0–52.0)
HEMOGLOBIN: 12 g/dL — AB (ref 13.0–17.0)
MCH: 27.2 pg (ref 26.0–34.0)
MCHC: 32.3 g/dL (ref 30.0–36.0)
MCV: 84.4 fL (ref 78.0–100.0)
Platelets: 322 10*3/uL (ref 150–400)
RBC: 4.41 MIL/uL (ref 4.22–5.81)
RDW: 15.5 % (ref 11.5–15.5)
WBC: 11.9 10*3/uL — ABNORMAL HIGH (ref 4.0–10.5)

## 2016-06-30 MED ORDER — DILTIAZEM HCL ER COATED BEADS 240 MG PO CP24: 240.0000 mg | ORAL_CAPSULE | Freq: Every day | ORAL | 0 refills | Status: DC

## 2016-06-30 MED ORDER — APIXABAN 5 MG PO TABS: 5.0000 mg | ORAL_TABLET | Freq: Two times a day (BID) | ORAL | 0 refills | Status: AC

## 2016-06-30 MED ORDER — ASPIRIN 81 MG PO CHEW: 81.0000 mg | CHEWABLE_TABLET | Freq: Every day | ORAL | 0 refills | Status: DC

## 2016-06-30 MED ORDER — DILTIAZEM HCL ER COATED BEADS 180 MG PO CP24
300.0000 mg | ORAL_CAPSULE | Freq: Every day | ORAL | Status: DC
Start: 2016-07-01 — End: 2016-06-30

## 2016-06-30 MED ORDER — DILTIAZEM HCL ER COATED BEADS 300 MG PO CP24: 300.0000 mg | ORAL_CAPSULE | Freq: Every day | ORAL | 0 refills | Status: DC

## 2016-06-30 MED ORDER — ZOLPIDEM TARTRATE 10 MG PO TABS: 10.0000 mg | ORAL_TABLET | Freq: Every evening | ORAL | 0 refills | Status: DC | PRN

## 2016-06-30 NOTE — Progress Notes (Signed)
Progress Note  Patient Name: Xavier Ford Date of Encounter: 06/30/2016  Primary Cardiologist: Dr Royann Shiversroitoru  Subjective   No chest pain- slept well after Janit BernAmbian  Inpatient Medications    Scheduled Meds: . allopurinol  300 mg Oral Daily  . apixaban  5 mg Oral BID  . aspirin  81 mg Oral Daily  . atorvastatin  80 mg Oral Daily  . benztropine  1 mg Oral BID  . diltiazem  240 mg Oral Daily  . docusate sodium  100 mg Oral BID  . gabapentin  300 mg Oral TID  . isosorbide mononitrate  30 mg Oral Daily  . sodium chloride flush  3 mL Intravenous Q12H   Continuous Infusions:  PRN Meds: sodium chloride, acetaminophen, gi cocktail, ondansetron (ZOFRAN) IV, oxyCODONE-acetaminophen, sodium chloride flush, traMADol, zolpidem   Vital Signs    Vitals:   06/29/16 2018 06/30/16 0000 06/30/16 0354 06/30/16 0757  BP: (!) 160/87 (!) 171/86 (!) 145/89 (!) 162/98  Pulse: 70 69 63 75  Resp: 20 (!) 24 20 20   Temp: 98.1 F (36.7 C) 97.8 F (36.6 C) 98 F (36.7 C) 98.1 F (36.7 C)  TempSrc: Oral Oral Oral Oral  SpO2: 94% 94% 94% 92%  Weight:      Height:        Intake/Output Summary (Last 24 hours) at 06/30/16 0921 Last data filed at 06/30/16 0759  Gross per 24 hour  Intake          1009.33 ml  Output             2100 ml  Net         -1090.67 ml   Filed Weights   06/27/16 0627  Weight: 243 lb 9.7 oz (110.5 kg)    Telemetry    NSR - Personally Reviewed  ECG     06/28/16- A flutter with RVRPersonally Reviewed  Physical Exam   GEN: No acute distress.   Neck: No JVD Cardiac: RRR, no murmurs, rubs, or gallops.  Respiratory: Clear to auscultation bilaterally. GI: Soft, nontender, non-distended  MS: No edema; No deformity. Neuro:  Nonfocal  Psych: Normal affect   Labs    Chemistry Recent Labs Lab 06/27/16 1057 06/29/16 0237 06/30/16 0240  NA 138 138 135  K 4.1 4.5 4.3  CL 105 106 105  CO2 24 25 23   GLUCOSE 100* 131* 171*  BUN 24* 18 17  CREATININE 1.80*  1.67* 1.57*  CALCIUM 9.0 8.9 9.1  GFRNONAA 39* 43* 46*  GFRAA 45* 50* 54*  ANIONGAP 9 7 7      Hematology Recent Labs Lab 06/26/16 2319 06/29/16 0237 06/30/16 0240  WBC 6.3 11.9* 11.9*  RBC 4.87 4.27 4.41  HGB 13.1 11.5* 12.0*  HCT 40.3 36.0* 37.2*  MCV 82.8 84.3 84.4  MCH 26.9 26.9 27.2  MCHC 32.5 31.9 32.3  RDW 15.2 15.4 15.5  PLT 328 325 322    Cardiac Enzymes Recent Labs Lab 06/27/16 0531 06/27/16 0905  TROPONINI 0.03* 0.03*    Recent Labs Lab 06/26/16 2341  TROPIPOC 0.04     BNPNo results for input(s): BNP, PROBNP in the last 168 hours.   DDimer No results for input(s): DDIMER in the last 168 hours.   Radiology    CXR 06/27/16- EXAM: CHEST  2 VIEW  COMPARISON:  None.  FINDINGS: Low lung volumes. Limited lateral view due to patient's overlying soft tissues. No pleural effusion. Streaky atelectasis at the left base and lingula. Mild cardiomegaly.  No pneumothorax.  IMPRESSION: 1. Mild cardiomegaly without overt failure 2. Streaky atelectasis at the left lung base.   Cardiac Studies   Cath 06/27/16- 40% LAD, normal LVF  Patient Profile     61 y.o.AA male admitted 06/27/16 with chest pain and SOB after cocaine use. He has a history of polysubstance abuse, hx of Atrial fibrillation not on AC because of compliance, Schizoaffective disorder, CKD III baseline Cr 1.6. He was admitted to [facility] at the beginning of this month for same, was seen by Cardiology there, underwent Cadriolite there that showed inferoapical and lateral wall reperfusion suggesting ischemia. They offered LHC but he left AMA. Cath here 06/27/16 showed moderate LAD dis, normal LVF. Since admission he has had atrial flutter. Diltiazem and Eliquis added.   Assessment & Plan   -CAD- The patient presented with chest pain and minimal troponin elevations. Cath reveals ectatic coronary arteries as well as moderate disease involving the LAD and diagonal system with normal  LVF.  -Dyslipidemia- statin added     CKD (chronic kidney disease) stage 3, GFR 30-59 ml/min - Baseline Cr 1.6 - Cr is now within baseline range     Essential hypertension - Currently on Imdur 30 mg and Diltiazem 240 mg. He may need addition of Hydralazine    Polysubstance abuse, cocaine abuse - Counseled on cessation    Schizoaffective disorder, bipolar type (HCC) - Continue Cogentin 1 mg twice daily    AF (paroxysmal atrial fibrillation) (HCC) / A flutter - NSR this am - CHADS vasc score 2 - Apixaban started for anticoagulation   - continue baby ASA with CAD  Plan-  We'll see what his B/P is after he gets his morning medications. Would try to keep medications as simple as possible to increase compliance (not sure he will be able to take Hydralazine TID). Document TSH.   Signed, Corine Shelter, PA-C  06/30/2016, 9:21 AM    As above; pt seen and examined; no dyspnea; tender over left lateral chest area; cath revealed mild to moderate disease; plan medical therapy; DC ASA given need for apixaban; continue statin; in sinus this AM; continue cardizem (increase to 300 mg daily for improved BP control) and apixaban. Pt can be DCed from a cardiac standpoint and fU with Dr Royann Shivers. We will sign off. Please call with questions. Olga Millers, MD

## 2016-06-30 NOTE — Progress Notes (Signed)
Discharge note. Patient educated by RN on medications and when to take them, when to call the MD, wound care, diet recommendations, activity instructions, information on new medications, and follow up appointments.   Patient refused to be wheeled out by a staff member.   Curtis SitesHayley H Pearse Shiffler, RN

## 2016-06-30 NOTE — Care Management Note (Addendum)
Case Management Note  Patient Details  Name: Xavier RenshawJoseph L Ford MRN: [id number] Date of Birth: 05-07-1955  Subjective/Objective:   Presents with chest pain,psa, for heart cath.  Patient is from home alone, pta indep.  [facility] [place] [facility] [place] [facility] [place] Jerelyn Charlesompos, [facility] [address], he has an apt with them on 4/2.  Patient states he will need a part city bus pass and a [place] bus pass.  NCM notified Dahlia ClientHannah the CSW.  Patient is for dc today, he has medication coverage and he sees Dr. Jerelyn [facility]ompos [facility].                     Action/Plan:   Expected Discharge Date:  06/30/16               Expected Discharge Plan:  Home/Self Care  In-House Referral:     Discharge planning Services  CM Consult  Post Acute Care Choice:    Choice offered to:     DME Arranged:    DME Agency:     HH Arranged:    HH Agency:     Status of Service:  Completed, signed off  If discussed at [employer] Length of Stay Meetings, dates discussed:    Additional Comments:  Leone Havenaylor, Nayeliz Hipp Clinton, RN 06/30/2016, 2:34 PM

## 2016-06-30 NOTE — Discharge Summary (Signed)
Physician Discharge Summary  Xavier Ford:[id number] DOB: 24-Jan-1956 DOA: 06/27/2016  PCP: No PCP Per Patient  Admit date: 06/27/2016 Discharge date: 06/30/2016  Recommendations for Outpatient Follow-up:  Please note new medication: Continue apixaban twice a day Continue Cardizem 300 mg a day  Discharge Diagnoses:  Principal Problem:   Chest pain Active Problems:   CKD (chronic kidney disease) stage 3, GFR 30-59 ml/min   Gout   Hypertension   Polysubstance abuse   Schizoaffective disorder, bipolar type (HCC)   AF (paroxysmal atrial fibrillation) (HCC)    Discharge Condition: stable   Diet recommendation: as tolerated   History of present illness:  61 year old male with past medical history significant for polysubstance abuse including cocaine, atrial fibrillation and not on anticoagulation due to noncompliance, schizoaffective disorder, chronic kidney disease stage III with baseline creatinine of 1.6. Patient presented with chest pain lasting for last day or so prior to this admission, associated diaphoresis, shortness of breath and fatigue. He was admitted to [facility] at the beginning of this month for the same problem. He underwent Cadriolite there that showed "Inferoapical and lateral wall reperfusion suggesting ischemia. Ejection fraction was calculated to be 60% and a normal wall motion. He was offered to have a left heart catheterization but left AGAINST MEDICAL ADVICE.  Patient underwent cardiac catheterization here in [facility] during this admission. He was found to have large, ectatic coronary arteries, mild to moderate, diffuse LAD disease of up to 50%, normal left ventricular filling pressure. Recommendation is to continue medical management.  Hospital Course:  Principal Problem:   Chest pain / troponin level elevation - Status post cardiac cath 06/27/2016 with findings of large, ectatic coronary arteries, mild to moderate, diffuse LAD disease of up to 50%,  normal left ventricular filling pressure. - Continue medical management per cardio  - Continue apixaban  - Continue Lipitor 80 mg daily   Active Problems:   CKD (chronic kidney disease) stage 3, GFR 30-59 ml/min - Baseline Cr 1.6 - Cr is now within baseline range     Gout - Continue allopurinol    Essential hypertension - Continue isosorbide mononitrate and cardizem    Polysubstance abuse, cocaine abuse - Counseled on cessation    Schizoaffective disorder, bipolar type (HCC) - Continue Cogentin 1 mg twice daily    AF (paroxysmal atrial fibrillation) (HCC) / A flutter  - CHADS vasc score 2 - Was on Cardizem drip  - He will discharged with Cardizem 300 mg daily  - Apixaban started for anticoagulation    DVT prophylaxis: Apixaban Code Status: full code  Family Communication: no family at the bedside this am    Consultants:   Cardio   Procedures:   Cardiac cath 06/27/2016 with findings of large, ectatic coronary arteries, mild to moderate, diffuse LAD disease of up to 50%, normal left ventricular filling pressure.  Antimicrobials:   None     Signed:  Manson Passey, MD  [facility] 06/30/2016, 2:09 PM  Pager #: [phone]  Time spent in minutes: less than 30 minutes   Discharge Exam: Vitals:   06/30/16 0757 06/30/16 1210  BP: (!) 162/98 (!) 148/108  Pulse: 75 77  Resp: 20 17  Temp: 98.1 F (36.7 C) 98.9 F (37.2 C)   Vitals:   06/30/16 0000 06/30/16 0354 06/30/16 0757 06/30/16 1210  BP: (!) 171/86 (!) 145/89 (!) 162/98 (!) 148/108  Pulse: 69 63 75 77  Resp: (!) 24 20 20 17   Temp: 97.8 F (36.6 C)  98 F (36.7 C) 98.1 F (36.7 C) 98.9 F (37.2 C)  TempSrc: Oral Oral Oral Oral  SpO2: 94% 94% 92% 97%  Weight:      Height:        General: Pt is alert, follows commands appropriately, not in acute distress Cardiovascular: Regular rate and rhythm, S1/S2 + Respiratory: Clear to auscultation bilaterally, no wheezing, no  crackles, no rhonchi Abdominal: Soft, non tender, non distended, bowel sounds +, no guarding Extremities: no edema, no cyanosis, pulses palpable bilaterally DP and PT Neuro: Grossly nonfocal  Discharge Instructions  Discharge Instructions    Call MD for:  persistant nausea and vomiting    Complete by:  As directed    Call MD for:  redness, tenderness, or signs of infection (pain, swelling, redness, odor or green/yellow discharge around incision site)    Complete by:  As directed    Call MD for:  severe uncontrolled pain    Complete by:  As directed    Diet - low sodium heart healthy    Complete by:  As directed    Discharge instructions    Complete by:  As directed    Please note new medication: Continue apixaban twice a day Continue Cardizem 300 mg a day   Increase activity slowly    Complete by:  As directed      Allergies as of 06/30/2016      Reactions   Nsaids Hives   Gabapentin    Other reaction(s): Cough (ALLERGY/intolerance), GI Upset (intolerance) Patient states after taking the medication, his throat started swelling, he had a horrible headache and abd cramping.   Morphine And Related Itching      Medication List    STOP taking these medications   aspirin 325 MG tablet   predniSONE 20 MG tablet Commonly known as:  DELTASONE     TAKE these medications   allopurinol 300 MG tablet Commonly known as:  ZYLOPRIM Take 300 mg by mouth daily.   apixaban 5 MG Tabs tablet Commonly known as:  ELIQUIS Take 1 tablet (5 mg total) by mouth 2 (two) times daily.   atorvastatin 80 MG tablet Commonly known as:  LIPITOR Take 80 mg by mouth daily.   benztropine 1 MG tablet Commonly known as:  COGENTIN Take 1 mg by mouth 2 (two) times daily.   diltiazem 300 MG 24 hr capsule Commonly known as:  CARDIZEM CD Take 1 capsule (300 mg total) by mouth daily. Start taking on:  07/01/2016 What changed:  medication strength  how much to take   docusate sodium 100 MG  capsule Commonly known as:  COLACE Take 100 mg by mouth 2 (two) times daily as needed for mild constipation.   gabapentin 300 MG capsule Commonly known as:  NEURONTIN Take 300 mg by mouth 3 (three) times daily.   INVEGA SUSTENNA 39 MG/0.25ML Susp Generic drug:  Paliperidone Palmitate Inject 39 mg into the muscle every 28 (twenty-eight) days.   isosorbide mononitrate 30 MG 24 hr tablet Commonly known as:  IMDUR Take 30 mg by mouth daily.   methocarbamol 500 MG tablet Commonly known as:  ROBAXIN Take 500 mg by mouth 3 (three) times daily.   nitroGLYCERIN 0.4 MG SL tablet Commonly known as:  NITROSTAT Place 0.4 mg under the tongue every 5 (five) minutes as needed for chest pain.   oxyCODONE-acetaminophen 5-325 MG tablet Commonly known as:  PERCOCET/ROXICET Take 1 tablet by mouth every 4 (four) hours as needed for severe pain.  senna-docusate 8.6-50 MG tablet Commonly known as:  Senokot-S Take 1 tablet by mouth 2 (two) times daily as needed for mild constipation.   Vitamin D (Ergocalciferol) 50000 units Caps capsule Commonly known as:  DRISDOL Take 50,000 Units by mouth every 7 (seven) days.   zolpidem 10 MG tablet Commonly known as:  AMBIEN Take 1 tablet (10 mg total) by mouth at bedtime as needed for sleep.      Follow-up Information    Thurmon FairMihai Croitoru, MD Follow up.   Specialty:  Cardiology Why:  office will contact you Contact information: [address] [place] [address] [phone]            The results of significant diagnostics from this hospitalization (including imaging, microbiology, ancillary and laboratory) are listed below for reference.    Significant Diagnostic Studies: Dg Chest 2 View  Result Date: 06/27/2016 CLINICAL DATA:  Chest pain and shortness of breath EXAM: CHEST  2 VIEW COMPARISON:  None. FINDINGS: Low lung volumes. Limited lateral view due to patient's overlying soft tissues. No pleural effusion. Streaky  atelectasis at the left base and lingula. Mild cardiomegaly. No pneumothorax. IMPRESSION: 1. Mild cardiomegaly without overt failure 2. Streaky atelectasis at the left lung base. Electronically Signed   By: Jasmine PangKim  Fujinaga M.D.   On: 06/27/2016 00:11    Microbiology: Recent Results (from the past 240 hour(s))  MRSA PCR Screening     Status: None   Collection Time: 06/27/16  6:48 AM  Result Value Ref Range Status   MRSA by PCR NEGATIVE NEGATIVE Final    Comment:        The GeneXpert MRSA Assay (FDA approved for NASAL specimens only), is one component of a comprehensive MRSA colonization surveillance program. It is not intended to diagnose MRSA infection nor to guide or monitor treatment for MRSA infections.      Labs: Basic Metabolic Panel:  Recent Labs Lab 06/26/16 2319 06/27/16 1057 06/29/16 0237 06/30/16 0240  NA 136 138 138 135  K 3.6 4.1 4.5 4.3  CL 102 105 106 105  CO2 23 24 25 23   GLUCOSE 123* 100* 131* 171*  BUN 22* 24* 18 17  CREATININE 1.87* 1.80* 1.67* 1.57*  CALCIUM 9.7 9.0 8.9 9.1   Liver Function Tests: No results for input(s): AST, ALT, ALKPHOS, BILITOT, PROT, ALBUMIN in the last 168 hours. No results for input(s): LIPASE, AMYLASE in the last 168 hours. No results for input(s): AMMONIA in the last 168 hours. CBC:  Recent Labs Lab 06/26/16 2319 06/29/16 0237 06/30/16 0240  WBC 6.3 11.9* 11.9*  HGB 13.1 11.5* 12.0*  HCT 40.3 36.0* 37.2*  MCV 82.8 84.3 84.4  PLT 328 325 322   Cardiac Enzymes:  Recent Labs Lab 06/27/16 0531 06/27/16 0905  TROPONINI 0.03* 0.03*   BNP: BNP (last 3 results) No results for input(s): BNP in the last 8760 hours.  ProBNP (last 3 results) No results for input(s): PROBNP in the last 8760 hours.  CBG: No results for input(s): GLUCAP in the last 168 hours.

## 2016-07-08 ENCOUNTER — Telehealth: Payer: Self-pay | Admitting: Cardiology

## 2016-07-08 NOTE — Telephone Encounter (Signed)
Closed encounter °

## 2016-07-25 ENCOUNTER — Ambulatory Visit: Payer: Medicaid Other | Admitting: Cardiology

## 2016-07-25 ENCOUNTER — Encounter: Payer: Self-pay | Admitting: *Deleted

## 2016-07-25 DIAGNOSIS — I2510 Atherosclerotic heart disease of native coronary artery without angina pectoris: Secondary | ICD-10-CM | POA: Insufficient documentation

## 2016-07-25 DIAGNOSIS — Z7901 Long term (current) use of anticoagulants: Secondary | ICD-10-CM | POA: Insufficient documentation

## 2016-07-28 ENCOUNTER — Emergency Department (HOSPITAL_COMMUNITY)
Admission: EM | Admit: 2016-07-28 | Discharge: 2016-07-28 | Disposition: A | Discharge status: Home / Self Care | Payer: Medicaid Other | Attending: Emergency Medicine | Admitting: Emergency Medicine

## 2016-07-28 ENCOUNTER — Encounter (HOSPITAL_COMMUNITY): Payer: Self-pay

## 2016-07-28 DIAGNOSIS — Z79899 Other long term (current) drug therapy: Secondary | ICD-10-CM | POA: Diagnosis not present

## 2016-07-28 DIAGNOSIS — T560X1A Toxic effect of lead and its compounds, accidental (unintentional), initial encounter: Secondary | ICD-10-CM | POA: Diagnosis not present

## 2016-07-28 DIAGNOSIS — I129 Hypertensive chronic kidney disease with stage 1 through stage 4 chronic kidney disease, or unspecified chronic kidney disease: Secondary | ICD-10-CM | POA: Diagnosis not present

## 2016-07-28 DIAGNOSIS — M109 Gout, unspecified: Secondary | ICD-10-CM | POA: Diagnosis present

## 2016-07-28 DIAGNOSIS — Z7901 Long term (current) use of anticoagulants: Secondary | ICD-10-CM | POA: Diagnosis not present

## 2016-07-28 DIAGNOSIS — N183 Chronic kidney disease, stage 3 (moderate): Secondary | ICD-10-CM | POA: Insufficient documentation

## 2016-07-28 DIAGNOSIS — M10161 Lead-induced gout, right knee: Secondary | ICD-10-CM | POA: Insufficient documentation

## 2016-07-28 DIAGNOSIS — M10169 Lead-induced gout, unspecified knee: Secondary | ICD-10-CM

## 2016-07-28 DIAGNOSIS — I25110 Atherosclerotic heart disease of native coronary artery with unstable angina pectoris: Secondary | ICD-10-CM | POA: Diagnosis not present

## 2016-07-28 DIAGNOSIS — M10162 Lead-induced gout, left knee: Secondary | ICD-10-CM | POA: Diagnosis not present

## 2016-07-28 MED ORDER — OXYCODONE-ACETAMINOPHEN 5-325 MG PO TABS: 1.0000 | ORAL_TABLET | ORAL | 0 refills | Status: DC | PRN

## 2016-07-28 MED ORDER — PREDNISONE 20 MG PO TABS
60.0000 mg | ORAL_TABLET | Freq: Once | ORAL | Status: AC
  Administered 2016-07-28: 60 mg via ORAL
  Filled 2016-07-28: qty 3

## 2016-07-28 MED ORDER — PREDNISONE 20 MG PO TABS: ORAL_TABLET | ORAL | 0 refills | Status: DC

## 2016-07-28 NOTE — ED Notes (Addendum)
MD at bedside. MD aware of pt's intermittent tachycardia. Pt denies chest pain, SOB or any associated symptoms and states he is only here for knee pain. Pt does have hx of afib and states he did not take his medication this morning.

## 2016-07-28 NOTE — ED Provider Notes (Addendum)
[facility]-EMERGENCY DEPT Provider Note   CSN: [id number] Arrival date & time: 07/28/16  0741     History   Chief Complaint Chief Complaint  Patient presents with  . Gout    HPI Xavier Ford is a 61 y.o. male.  61 yo M with a chief complaint of bilateral knee pain. Patient has had gout in both of these knees before. Thinks it feels the same. Denies acute injury. Denies fevers or chills. Denies any break in the skin. Normally his symptoms are controlled well with narcotics and prednisone.   The history is provided by the patient.  Illness  This is a new problem. The current episode started yesterday. The problem occurs constantly. The problem has not changed since onset.Pertinent negatives include no chest pain, no abdominal pain, no headaches and no shortness of breath. The symptoms are aggravated by walking, bending and twisting. Nothing relieves the symptoms. He has tried nothing for the symptoms. The treatment provided no relief.    Past Medical History:  Diagnosis Date  . Atrial fibrillation (HCC)   . CKD (chronic kidney disease), stage III   . Gout   . Hypertension   . Schizoaffective disorder [facility])     Patient Active Problem List   Diagnosis Date Noted  . CAD (coronary artery disease) 07/25/2016  . Chronic anticoagulation 07/25/2016  . Unstable angina (HCC) 06/27/2016  . PAF (paroxysmal atrial fibrillation) (HCC) 06/27/2016  . CKD (chronic kidney disease) stage 3, GFR 30-59 ml/min 02/18/2016  . Gout 01/06/2015  . Essential hypertension 01/06/2015  . Polysubstance abuse 01/10/2011  . Schizoaffective disorder, bipolar type (HCC) 01/09/2002    Past Surgical History:  Procedure Laterality Date  . Left finger pinning    . LEFT HEART CATH AND CORONARY ANGIOGRAPHY N/A 06/27/2016   Procedure: Left Heart Cath and Coronary Angiography;  Surgeon: Yvonne Kendall, MD;  Location: [facility] INVASIVE CV LAB;  Service: Cardiovascular;  Laterality: N/A;  . ORIF left radius          Home Medications    Prior to Admission medications   Medication Sig Start Date End Date Taking? Authorizing Provider  allopurinol (ZYLOPRIM) 300 MG tablet Take 300 mg by mouth daily.    Historical Provider, MD  apixaban (ELIQUIS) 5 MG TABS tablet Take 1 tablet (5 mg total) by mouth 2 (two) times daily. 06/30/16   Alison Murray, MD  atorvastatin (LIPITOR) 80 MG tablet Take 80 mg by mouth daily.    Historical Provider, MD  benztropine (COGENTIN) 1 MG tablet Take 1 mg by mouth 2 (two) times daily.    Historical Provider, MD  diltiazem (CARDIZEM CD) 300 MG 24 hr capsule Take 1 capsule (300 mg total) by mouth daily. 07/01/16   Alison Murray, MD  docusate sodium (COLACE) 100 MG capsule Take 100 mg by mouth 2 (two) times daily as needed for mild constipation.    Historical Provider, MD  gabapentin (NEURONTIN) 300 MG capsule Take 300 mg by mouth 3 (three) times daily.    Historical Provider, MD  isosorbide mononitrate (IMDUR) 30 MG 24 hr tablet Take 30 mg by mouth daily.    Historical Provider, MD  methocarbamol (ROBAXIN) 500 MG tablet Take 500 mg by mouth 3 (three) times daily.    Historical Provider, MD  nitroGLYCERIN (NITROSTAT) 0.4 MG SL tablet Place 0.4 mg under the tongue every 5 (five) minutes as needed for chest pain.    Historical Provider, MD  oxyCODONE-acetaminophen (PERCOCET/ROXICET) 5-325 MG tablet Take 1-2 tablets  by mouth every 4 (four) hours as needed for severe pain. 07/28/16   Melene Plan, DO  Paliperidone Palmitate (INVEGA SUSTENNA) 39 MG/0.25ML SUSP Inject 39 mg into the muscle every 28 (twenty-eight) days.    Historical Provider, MD  predniSONE (DELTASONE) 20 MG tablet 3 tabs po daily x 4 days 07/28/16   Melene Plan, DO  senna-docusate (SENOKOT-S) 8.6-50 MG tablet Take 1 tablet by mouth 2 (two) times daily as needed for mild constipation.    Historical Provider, MD  Vitamin D, Ergocalciferol, (DRISDOL) 50000 units CAPS capsule Take 50,000 Units by mouth every 7 (seven) days.     Historical Provider, MD  zolpidem (AMBIEN) 10 MG tablet Take 1 tablet (10 mg total) by mouth at bedtime as needed for sleep. 06/30/16   Alison Murray, MD    Family History Family History  Problem Relation Age of Onset  . Hyperlipidemia Maternal Grandmother     Social History Social History  Substance Use Topics  . Smoking status: Never Smoker  . Smokeless tobacco: Never Used  . Alcohol use No     Allergies   Nsaids; Gabapentin; and Morphine and related   Review of Systems Review of Systems  Constitutional: Negative for chills and fever.  HENT: Negative for congestion and facial swelling.   Eyes: Negative for discharge and visual disturbance.  Respiratory: Negative for shortness of breath.   Cardiovascular: Negative for chest pain and palpitations.  Gastrointestinal: Negative for abdominal pain, diarrhea and vomiting.  Musculoskeletal: Positive for arthralgias. Negative for myalgias.  Skin: Negative for color change and rash.  Neurological: Negative for tremors, syncope and headaches.  Psychiatric/Behavioral: Negative for confusion and dysphoric mood.     Physical Exam Updated Vital Signs BP (!) 109/98   Pulse 76   Temp 98.1 F (36.7 C) (Oral)   Resp 18   SpO2 99%   Physical Exam  Constitutional: He is oriented to person, place, and time. He appears well-developed and well-nourished.  HENT:  Head: Normocephalic and atraumatic.  Eyes: EOM are normal. Pupils are equal, round, and reactive to light.  Neck: Normal range of motion. Neck supple. No JVD present.  Cardiovascular: Normal rate and regular rhythm.  Exam reveals no gallop and no friction rub.   No murmur heard. Pulmonary/Chest: No respiratory distress. He has no wheezes.  Abdominal: He exhibits no distension and no mass. There is no tenderness. There is no rebound and no guarding.  Musculoskeletal: Normal range of motion. He exhibits tenderness (diffusely about bilateral knees).  No signs of erythema or  edema  Neurological: He is alert and oriented to person, place, and time.  Skin: No rash noted. No pallor.  Psychiatric: He has a normal mood and affect. His behavior is normal.  Nursing note and vitals reviewed.    ED Treatments / Results  Labs (all labs ordered are listed, but only abnormal results are displayed) Labs Reviewed - No data to display  EKG  EKG Interpretation None       Radiology No results found.  Procedures Procedures (including critical care time)  Medications Ordered in ED Medications  predniSONE (DELTASONE) tablet 60 mg (not administered)     Initial Impression / Assessment and Plan / ED Course  I have reviewed the triage vital signs and the nursing notes.  Pertinent labs & imaging results that were available during my care of the patient were reviewed by me and considered in my medical decision making (see chart for details).  61 yo M With a chief complaint of bilateral knee pain. He exhibits typical of his gout. There is no signs to suggest septic arthritis. Patient states his symptoms usually improve with narcotics and prednisone. Drug database search with no recent prescriptions in the past year. PCP follow-up.  Of note patient in paroxsymal afib.  Patient without symptoms, discussed with him and he would not like this evaluated at this time.    8:34 AM:  I have discussed the diagnosis/risks/treatment options with the patient and believe the pt to be eligible for discharge home to follow-up with PCP. We also discussed returning to the ED immediately if new or worsening sx occur. We discussed the sx which are most concerning (e.g., sudden worsening pain, fever, inability to tolerate by mouth) that necessitate immediate return. Medications administered to the patient during their visit and any new prescriptions provided to the patient are listed below.  Medications given during this visit Medications  predniSONE (DELTASONE) tablet 60 mg (not  administered)     The patient appears reasonably screen and/or stabilized for discharge and I doubt any other medical condition or other [facility] requiring further screening, evaluation, or treatment in the ED at this time prior to discharge.    Final Clinical Impressions(s) / ED Diagnoses   Final diagnoses:  Lead-induced acute gout of knee, unspecified laterality, initial encounter    New Prescriptions New Prescriptions   OXYCODONE-ACETAMINOPHEN (PERCOCET/ROXICET) 5-325 MG TABLET    Take 1-2 tablets by mouth every 4 (four) hours as needed for severe pain.   PREDNISONE (DELTASONE) 20 MG TABLET    3 tabs po daily x 4 days     Melene Plan, DO 07/28/16 0981    Melene Plan, DO 07/28/16 [phone]

## 2016-07-28 NOTE — ED Triage Notes (Signed)
Pt reports he has pain in his knees bilaterally. He has hx of gout and states this feels like a flare up. No significant swelling noted.

## 2016-07-28 NOTE — ED Notes (Signed)
Pt's HR 145 before dropping down to 85.  Informed Brooke, Charity fundraiser.

## 2017-11-18 ENCOUNTER — Encounter (HOSPITAL_BASED_OUTPATIENT_CLINIC_OR_DEPARTMENT_OTHER): Payer: Self-pay | Admitting: *Deleted

## 2017-11-18 ENCOUNTER — Other Ambulatory Visit: Payer: Self-pay

## 2017-11-18 ENCOUNTER — Observation Stay (HOSPITAL_BASED_OUTPATIENT_CLINIC_OR_DEPARTMENT_OTHER)
Admission: EM | Admit: 2017-11-18 | Discharge: 2017-11-19 | Disposition: A | Discharge status: Home / Self Care | Payer: Medicaid Other | Attending: Internal Medicine | Admitting: Internal Medicine

## 2017-11-18 DIAGNOSIS — Z9119 Patient's noncompliance with other medical treatment and regimen: Secondary | ICD-10-CM | POA: Diagnosis not present

## 2017-11-18 DIAGNOSIS — G4733 Obstructive sleep apnea (adult) (pediatric): Secondary | ICD-10-CM | POA: Diagnosis not present

## 2017-11-18 DIAGNOSIS — Z79899 Other long term (current) drug therapy: Secondary | ICD-10-CM | POA: Diagnosis not present

## 2017-11-18 DIAGNOSIS — N183 Chronic kidney disease, stage 3 (moderate): Secondary | ICD-10-CM | POA: Insufficient documentation

## 2017-11-18 DIAGNOSIS — R739 Hyperglycemia, unspecified: Secondary | ICD-10-CM

## 2017-11-18 DIAGNOSIS — I10 Essential (primary) hypertension: Secondary | ICD-10-CM | POA: Diagnosis not present

## 2017-11-18 DIAGNOSIS — I129 Hypertensive chronic kidney disease with stage 1 through stage 4 chronic kidney disease, or unspecified chronic kidney disease: Secondary | ICD-10-CM | POA: Diagnosis not present

## 2017-11-18 DIAGNOSIS — F1410 Cocaine abuse, uncomplicated: Secondary | ICD-10-CM | POA: Insufficient documentation

## 2017-11-18 DIAGNOSIS — Z886 Allergy status to analgesic agent status: Secondary | ICD-10-CM | POA: Insufficient documentation

## 2017-11-18 DIAGNOSIS — E669 Obesity, unspecified: Secondary | ICD-10-CM | POA: Insufficient documentation

## 2017-11-18 DIAGNOSIS — R748 Abnormal levels of other serum enzymes: Secondary | ICD-10-CM | POA: Diagnosis present

## 2017-11-18 DIAGNOSIS — I4892 Unspecified atrial flutter: Principal | ICD-10-CM | POA: Insufficient documentation

## 2017-11-18 DIAGNOSIS — Z7901 Long term (current) use of anticoagulants: Secondary | ICD-10-CM | POA: Insufficient documentation

## 2017-11-18 DIAGNOSIS — I480 Paroxysmal atrial fibrillation: Secondary | ICD-10-CM | POA: Insufficient documentation

## 2017-11-18 DIAGNOSIS — M109 Gout, unspecified: Secondary | ICD-10-CM | POA: Insufficient documentation

## 2017-11-18 DIAGNOSIS — Z885 Allergy status to narcotic agent status: Secondary | ICD-10-CM | POA: Insufficient documentation

## 2017-11-18 DIAGNOSIS — F250 Schizoaffective disorder, bipolar type: Secondary | ICD-10-CM | POA: Insufficient documentation

## 2017-11-18 DIAGNOSIS — R778 Other specified abnormalities of plasma proteins: Secondary | ICD-10-CM

## 2017-11-18 DIAGNOSIS — Z888 Allergy status to other drugs, medicaments and biological substances status: Secondary | ICD-10-CM | POA: Diagnosis not present

## 2017-11-18 DIAGNOSIS — R7989 Other specified abnormal findings of blood chemistry: Secondary | ICD-10-CM

## 2017-11-18 DIAGNOSIS — I25110 Atherosclerotic heart disease of native coronary artery with unstable angina pectoris: Secondary | ICD-10-CM | POA: Insufficient documentation

## 2017-11-18 DIAGNOSIS — Z6836 Body mass index (BMI) 36.0-36.9, adult: Secondary | ICD-10-CM | POA: Diagnosis not present

## 2017-11-18 LAB — CBC WITH DIFFERENTIAL/PLATELET
Basophils Absolute: 0 10*3/uL (ref 0.0–0.1)
Basophils Relative: 0 %
Eosinophils Absolute: 0.1 10*3/uL (ref 0.0–0.7)
Eosinophils Relative: 1 %
HCT: 47.8 % (ref 39.0–52.0)
Hemoglobin: 16.6 g/dL (ref 13.0–17.0)
Lymphocytes Relative: 25 %
Lymphs Abs: 2.4 10*3/uL (ref 0.7–4.0)
MCH: 30.5 pg (ref 26.0–34.0)
MCHC: 34.7 g/dL (ref 30.0–36.0)
MCV: 87.9 fL (ref 78.0–100.0)
Monocytes Absolute: 0.8 10*3/uL (ref 0.1–1.0)
Monocytes Relative: 8 %
Neutro Abs: 6.1 10*3/uL (ref 1.7–7.7)
Neutrophils Relative %: 66 %
Platelets: 295 10*3/uL (ref 150–400)
RBC: 5.44 MIL/uL (ref 4.22–5.81)
RDW: 13.7 % (ref 11.5–15.5)
WBC: 9.4 10*3/uL (ref 4.0–10.5)

## 2017-11-18 LAB — BASIC METABOLIC PANEL
Anion gap: 10 (ref 5–15)
BUN: 30 mg/dL — ABNORMAL HIGH (ref 8–23)
CO2: 27 mmol/L (ref 22–32)
Calcium: 9.1 mg/dL (ref 8.9–10.3)
Chloride: 100 mmol/L (ref 98–111)
Creatinine, Ser: 1.88 mg/dL — ABNORMAL HIGH (ref 0.61–1.24)
GFR calc Af Amer: 43 mL/min — ABNORMAL LOW (ref >60)
GFR calc non Af Amer: 37 mL/min — ABNORMAL LOW (ref >60)
Glucose, Bld: 114 mg/dL — ABNORMAL HIGH (ref 70–99)
Potassium: 3.7 mmol/L (ref 3.5–5.1)
Sodium: 137 mmol/L (ref 135–145)

## 2017-11-18 LAB — TROPONIN I
TROPONIN I: 0.03 ng/mL — AB (ref <0.03)
Troponin I: 0.05 ng/mL (ref <0.03)
Troponin I: 0.05 ng/mL (ref <0.03)

## 2017-11-18 LAB — TSH: TSH: 1.419 u[IU]/mL (ref 0.350–4.500)

## 2017-11-18 LAB — MRSA PCR SCREENING: MRSA by PCR: NEGATIVE

## 2017-11-18 LAB — HEMOGLOBIN A1C
Hgb A1c MFr Bld: 6.5 % — ABNORMAL HIGH (ref 4.8–5.6)
MEAN PLASMA GLUCOSE: 139.85 mg/dL

## 2017-11-18 LAB — GLUCOSE, CAPILLARY: Glucose-Capillary: 199 mg/dL — ABNORMAL HIGH (ref 70–99)

## 2017-11-18 LAB — MAGNESIUM: Magnesium: 2.4 mg/dL (ref 1.7–2.4)

## 2017-11-18 MED ORDER — DILTIAZEM HCL ER COATED BEADS 180 MG PO CP24
300.0000 mg | ORAL_CAPSULE | Freq: Every day | ORAL | Status: DC
  Administered 2017-11-18 – 2017-11-19 (×2): 300 mg via ORAL
  Filled 2017-11-18 (×2): qty 1

## 2017-11-18 MED ORDER — DOCUSATE SODIUM 100 MG PO CAPS: 100.0000 mg | ORAL_CAPSULE | Freq: Two times a day (BID) | ORAL | Status: DC | PRN

## 2017-11-18 MED ORDER — SODIUM CHLORIDE 0.9 % IV SOLN
INTRAVENOUS | Status: DC | PRN
  Administered 2017-11-18: 1000 mL via INTRAVENOUS

## 2017-11-18 MED ORDER — ONDANSETRON HCL 4 MG PO TABS: 4.0000 mg | ORAL_TABLET | Freq: Four times a day (QID) | ORAL | Status: DC | PRN

## 2017-11-18 MED ORDER — APIXABAN 5 MG PO TABS
5.0000 mg | ORAL_TABLET | Freq: Two times a day (BID) | ORAL | Status: DC
  Administered 2017-11-18 – 2017-11-19 (×2): 5 mg via ORAL
  Filled 2017-11-18 (×2): qty 1

## 2017-11-18 MED ORDER — INSULIN ASPART 100 UNIT/ML ~~LOC~~ SOLN: 0.0000 [IU] | Freq: Three times a day (TID) | SUBCUTANEOUS | Status: DC

## 2017-11-18 MED ORDER — ATORVASTATIN CALCIUM 80 MG PO TABS
80.0000 mg | ORAL_TABLET | Freq: Every day | ORAL | Status: DC
  Administered 2017-11-18 – 2017-11-19 (×2): 80 mg via ORAL
  Filled 2017-11-18 (×2): qty 1

## 2017-11-18 MED ORDER — INSULIN ASPART 100 UNIT/ML ~~LOC~~ SOLN: 0.0000 [IU] | Freq: Every day | SUBCUTANEOUS | Status: DC

## 2017-11-18 MED ORDER — DILTIAZEM LOAD VIA INFUSION
20.0000 mg | Freq: Once | INTRAVENOUS | Status: AC
  Administered 2017-11-18: 20 mg via INTRAVENOUS
  Filled 2017-11-18: qty 20

## 2017-11-18 MED ORDER — OXYCODONE-ACETAMINOPHEN 5-325 MG PO TABS
2.0000 | ORAL_TABLET | Freq: Once | ORAL | Status: AC
  Administered 2017-11-18: 2 via ORAL
  Filled 2017-11-18: qty 2

## 2017-11-18 MED ORDER — DILTIAZEM HCL-DEXTROSE 100-5 MG/100ML-% IV SOLN (PREMIX)
5.0000 mg/h | INTRAVENOUS | Status: DC
  Administered 2017-11-18 (×2): 10 mg/h via INTRAVENOUS
  Filled 2017-11-18 (×2): qty 100

## 2017-11-18 MED ORDER — DILTIAZEM HCL-DEXTROSE 100-5 MG/100ML-% IV SOLN (PREMIX): 5.0000 mg/h | INTRAVENOUS | Status: DC

## 2017-11-18 MED ORDER — ONDANSETRON HCL 4 MG/2ML IJ SOLN: 4.0000 mg | Freq: Four times a day (QID) | INTRAMUSCULAR | Status: DC | PRN

## 2017-11-18 MED ORDER — OXYCODONE-ACETAMINOPHEN 5-325 MG PO TABS
1.0000 | ORAL_TABLET | Freq: Three times a day (TID) | ORAL | Status: DC | PRN
  Administered 2017-11-18 – 2017-11-19 (×2): 1 via ORAL
  Filled 2017-11-18 (×2): qty 1

## 2017-11-18 MED ORDER — ACETAMINOPHEN 650 MG RE SUPP: 650.0000 mg | Freq: Four times a day (QID) | RECTAL | Status: DC | PRN

## 2017-11-18 MED ORDER — PANTOPRAZOLE SODIUM 40 MG PO TBEC
40.0000 mg | DELAYED_RELEASE_TABLET | Freq: Every day | ORAL | Status: DC
  Administered 2017-11-19: 40 mg via ORAL
  Filled 2017-11-18: qty 1

## 2017-11-18 MED ORDER — ACETAMINOPHEN 325 MG PO TABS
650.0000 mg | ORAL_TABLET | Freq: Four times a day (QID) | ORAL | Status: DC | PRN
  Administered 2017-11-18: 650 mg via ORAL
  Filled 2017-11-18: qty 2

## 2017-11-18 NOTE — ED Notes (Signed)
Heart rate is in the 80's NSR with occasional PAC's.

## 2017-11-18 NOTE — H&P (Signed)
History and Physical    Xavier Ford ZOX:[id number] DOB: Aug 27, 1955 DOA: 11/18/2017  I have briefly reviewed the patient's prior medical records in [facility] Link  PCP: [facility]  Patient coming from: [facility] (has been at [facility])  Chief Complaint: chest discomfort  HPI: Xavier Ford is a 62 y.o. male with medical history significant of a fib, drug abuse, gout and CKD stage III. Yesterday was in his usual state of health when he developed some left sided chest pain, SOB and felt like his BP was increased.  He was brought to the ER and found to be in a fib with RVR (HR 140s).  He was started on a cardizem gtt and it appears that HR improved and he is back in sinus.   Troponin was elevated at 0.05 and is flat.  No chest pain currently.  Patient states this has happened to him in the past He denies fever and chills.  Denies recent drug use.   He has a heart cath in 2018. Does have gout and has had numerous recent flares  Review of Systems: As per HPI otherwise 10 point review of systems negative.   Past Medical History:  Diagnosis Date  . Atrial fibrillation (HCC)   . CKD (chronic kidney disease), stage III (HCC)   . Gout   . Hypertension   . Schizoaffective disorder [facility])     Past Surgical History:  Procedure Laterality Date  . Left finger pinning    . LEFT HEART CATH AND CORONARY ANGIOGRAPHY N/A 06/27/2016   Procedure: Left Heart Cath and Coronary Angiography;  Surgeon: Yvonne Kendall, MD;  [facility]AS[facility] CV LAB;  Service: Cardiovascular;  Laterality: N/A;  . ORIF left radius       reports that he has never smoked. He has never used smokeless tobacco. He reports that he has current or past drug history. Drug: Cocaine (not in last 30 days). He reports that he does not drink alcohol.  Allergies  Allergen Reactions  . Morphine Itching  . Tolmetin Hives and Swelling  . Beta Adrenergic Blockers Other (See Comments)    Cocaine Abuse  .  Ibuprofen Hives  . Naproxen Sodium Hives  . Nsaids Hives  . Colchicine Rash    Patient states that he was told to stay off of this med due to "kidney problems"  . Gabapentin     Other reaction(s): Cough (ALLERGY/intolerance), GI Upset (intolerance) Patient states after taking the medication, his throat started swelling, he had a horrible headache and abd cramping.  . Morphine And Related Itching    Family History  Problem Relation Age of Onset  . Hyperlipidemia Maternal Grandmother     Prior to Admission medications   Medication Sig Start Date End Date Taking? Authorizing Provider  allopurinol (ZYLOPRIM) 300 MG tablet Take 300 mg by mouth daily.    [provider]  apixaban (ELIQUIS) 5 MG TABS tablet Take 1 tablet (5 mg total) by mouth 2 (two) times daily. 06/30/16   Alison Murray, MD  atorvastatin (LIPITOR) 80 MG tablet Take 80 mg by mouth daily.    [provider]  benztropine (COGENTIN) 1 MG tablet Take 1 mg by mouth 2 (two) times daily.    [provider]  diltiazem (CARDIZEM CD) 300 MG 24 hr capsule Take 1 capsule (300 mg total) by mouth daily. 07/01/16   Alison Murray, MD  docusate sodium (COLACE) 100 MG capsule Take 100 mg by  mouth 2 (two) times daily as needed for mild constipation.    [provider]  gabapentin (NEURONTIN) 300 MG capsule Take 300 mg by mouth 3 (three) times daily.    [provider]  isosorbide mononitrate (IMDUR) 30 MG 24 hr tablet Take 30 mg by mouth daily.    [provider]  methocarbamol (ROBAXIN) 500 MG tablet Take 500 mg by mouth 3 (three) times daily.    [provider]  nitroGLYCERIN (NITROSTAT) 0.4 MG SL tablet Place 0.4 mg under the tongue every 5 (five) minutes as needed for chest pain.    [provider]  oxyCODONE-acetaminophen (PERCOCET/ROXICET) 5-325 MG tablet Take 1-2 tablets by mouth every 4 (four) hours as needed for severe pain. 07/28/16   Melene Plan, DO  Paliperidone  Palmitate (INVEGA SUSTENNA) 39 MG/0.25ML SUSP Inject 39 mg into the muscle every 28 (twenty-eight) days.    [provider]  predniSONE (DELTASONE) 20 MG tablet 3 tabs po daily x 4 days 07/28/16   Melene Plan, DO  senna-docusate (SENOKOT-S) 8.6-50 MG tablet Take 1 tablet by mouth 2 (two) times daily as needed for mild constipation.    [provider]  Vitamin D, Ergocalciferol, (DRISDOL) 50000 units CAPS capsule Take 50,000 Units by mouth every 7 (seven) days.    [provider]  zolpidem (AMBIEN) 10 MG tablet Take 1 tablet (10 mg total) by mouth at bedtime as needed for sleep. 06/30/16   Alison Murray, MD    Physical Exam: Vitals:   11/18/17 1400 11/18/17 1430 11/18/17 1500 11/18/17 1634  BP: 128/64 122/67 138/66   Pulse:  76 76   Resp: 20 18 18    Temp:    98.2 F (36.8 C)  TempSrc:      SpO2:  94% 96%   Weight:    118 kg (260 lb 2.3 oz)  Height:    5\' 11"  (1.803 m)      Constitutional: NAD, calm, comfortable Eyes: PERRL, lids and conjunctivae normal ENMT: Mucous membranes are moist. Posterior pharynx clear of any exudate or lesions Neck: normal, supple, no masses, no thyromegaly Respiratory: no wheezing. Normal respiratory effort. No accessory muscle use.  Cardiovascular: Regular rate and rhythm Abdomen: +BS, obese Musculoskeletal: no clubbing / cyanosis. Normal muscle tone.  Skin: no rashes, lesions, ulcers. No induration Neurologic: CN 2-12 grossly intact. Moves all 4 ext Psychiatric: Normal judgment and insight. Alert and oriented x 3. Normal mood.   Labs on Admission: I have personally reviewed following labs and imaging studies  CBC: Recent Labs  Lab 11/18/17 0922  WBC 9.4  NEUTROABS 6.1  HGB 16.6  HCT 47.8  MCV 87.9  PLT 295   Basic Metabolic Panel: Recent Labs  Lab 11/18/17 0922  NA 137  K 3.7  CL 100  CO2 27  GLUCOSE 114*  BUN 30*  CREATININE 1.88*  CALCIUM 9.1  MG 2.4   GFR: Estimated Creatinine Clearance: 53.2 mL/min  (A) (by C-G formula based on SCr of 1.88 mg/dL (H)). Liver Function Tests: No results for input(s): AST, ALT, ALKPHOS, BILITOT, PROT, ALBUMIN in the last 168 hours. No results for input(s): LIPASE, AMYLASE in the last 168 hours. No results for input(s): AMMONIA in the last 168 hours. Coagulation Profile: No results for input(s): INR, PROTIME in the last 168 hours. Cardiac Enzymes: Recent Labs  Lab 11/18/17 0922 11/18/17 1304  TROPONINI 0.05* 0.05*   BNP (last 3 results) No results for input(s): PROBNP in the last 8760 hours.  HbA1C: No results for input(s): HGBA1C in the last 72 hours. CBG: No results for input(s): GLUCAP in the last 168 hours. Lipid Profile: No results for input(s): CHOL, HDL, LDLCALC, TRIG, CHOLHDL, LDLDIRECT in the last 72 hours. Thyroid Function Tests: No results for input(s): TSH, T4TOTAL, FREET4, T3FREE, THYROIDAB in the last 72 hours. Anemia Panel: No results for input(s): VITAMINB12, FOLATE, FERRITIN, TIBC, IRON, RETICCTPCT in the last 72 hours. Urine analysis: No results found for: COLORURINE, APPEARANCEUR, LABSPEC, PHURINE, GLUCOSEU, HGBUR, BILIRUBINUR, KETONESUR, PROTEINUR, UROBILINOGEN, NITRITE, LEUKOCYTESUR   Radiological Exams on Admission: No results found.  EKG: Ind[facility] with [facility]  Assessment/Plan Active Problems:   Atrial flutter with rapid ventricular response (HCC)    A fib with RVR -cycle CE -echo 2D (no recent one in our system) -on cardizem gtt with good response, wean to off and restart home medications -check TSH, HgbA1c  CAD -last cath 2018 -non-obstructive disease  H/o cocaine abuse -last use 30 days ago -UDS pending  CKD III -avoid nephrotoxic drugs  Reported impaired glucose tolerance -SSI -check HgbA1C  OSA -cpap if willing  Gout Allopurinol 300 mg daily Prednisone 10 mg??-- await pharmacy to do medication reconciliation to confirm-- patient not sure of his  medications  Obesity Body mass index is 36.28 kg/m.    DVT prophylaxis: eliquis  Code Status: full     Admission status: tele obs   At the point of initial evaluation, it is my clinical opinion that admission for OBSERVATION is reasonable and necessary because the patient's presenting complaints in the context of their chronic conditions represent sufficient risk of deterioration or significant morbidity to constitute reasonable grounds for close observation in the hospital setting, but that the patient may be medically stable for discharge from the hospital within 24 to 48 hours.   Richardo HanksAJessica U Daevion Navarette [facility]   If 7PM-7AM, please contact night-coverage [email or web address] Password [email or web address]  11/18/2017, 5:01 PM

## 2017-11-18 NOTE — ED Notes (Signed)
[facility]

## 2017-11-18 NOTE — Progress Notes (Signed)
Placed pt on CPAP with BIPAP settings of 20/6, added H2O to water chamber and pt says that settings are good. Educated pt to call if needed anything.

## 2017-11-18 NOTE — ED Notes (Signed)
Heart rate has been in the 140's to 160's-A-fib.  Patient is asymptomatic.  Cardizem drip started, verified with Amy, RN.  Heart rate still on A-fib with a rate between 90's to 110's. Patient is resting comfortably.

## 2017-11-18 NOTE — ED Notes (Signed)
Cardizem drip titrated to 15 mg from 10 mg.  HR has in the 140's, asymptomatic.

## 2017-11-18 NOTE — ED Notes (Signed)
Date and time results received: 11/18/17 1055 (use smartphrase ".now" to insert current time)  Test: Troponin Critical Value: 0.05  Name of Provider Notified: Dr. Juleen ChinaKohut  Orders Received? Or Actions Taken?: none

## 2017-11-18 NOTE — Progress Notes (Signed)
   62 year old male with past medical history relevant for atrial flutter supposedly on chronic anticoagulation, history of noncompliance, CKD stage III, and schizoaffective/bipolar disorder and polysubstance abuse including cocaine [facility] with complaints of chest discomfort was found to be in Atrial flutter with RVR now on Cardizem drip with challenging rate control.  Urine drug screen pending he is currently chest pain-free and borderline troponin noted,  LHC on 06/27/2016 with nonobstructive CAD  [facility],  Patient will need serial troponins for ACS rule out, possibly echocardiogram if troponin continues to go up to rule out regional wall motion normalities  Please follow UDS results  Shon Haleourage Vincenzina Jagoda, MD

## 2017-11-18 NOTE — ED Provider Notes (Signed)
[facility] EMERGENCY DEPARTMENT Provider Note   CSN: [id number] Arrival date & time: 11/18/17  4098     History   Chief Complaint Chief Complaint  Patient presents with  . Chest Pain    HPI Xavier Ford is a 62 y.o. male.  HPI   62 year old male with generalized weakness and dyspnea with exertion.  Symptom onset sometime yesterday.  He cannot remember what he was specifically doing when symptoms started.  Also some mild pain in his left chest.  He is currently  a treatment facility for history of drug use.  He states that they told him how how he was feeling so they checked his blood pressure and referred to the to the emergency room because it was very elevated.  He has a past history of cocaine/substance abuse.  He adamantly denies any recent usage.  He states he has been compliant with his medications recently as they are provided to him by his facility.  He has a past history of atrial fibrillation/flutter.  He has had very similar symptoms previously when his heart rate "goes crazy."    He has been admitted previously for very similar symptoms.  He was previously on coumadin but stopped because of noncompliance.  Per clinic note from 10/23/17 he was in a sinus rhythm. He may be on eliquis? Mentioned in prior notes and on med list. Pt is not very familiar with his medications himself. He had a left heart catheterization during admission in April 2018 which showed large, ectatic coronary arteries, mild to moderate, diffuse LAD disease of up to 50%, normal left ventricular filling pressure.  Also c/o pain in R big toe. Has hx of gout and thinks pain is from this. Denies any trauma.    Past Medical History:  Diagnosis Date  . Atrial fibrillation (HCC)   . CKD (chronic kidney disease), stage III (HCC)   . Gout   . Hypertension   . Schizoaffective disorder [facility])     Patient Active Problem List   Diagnosis Date Noted  . CAD (coronary artery disease) 07/25/2016  .  Chronic anticoagulation 07/25/2016  . Unstable angina (HCC) 06/27/2016  . PAF (paroxysmal atrial fibrillation) (HCC) 06/27/2016  . CKD (chronic kidney disease) stage 3, GFR 30-59 ml/min (HCC) 02/18/2016  . Gout 01/06/2015  . Essential hypertension 01/06/2015  . Polysubstance abuse (HCC) 01/10/2011  . Schizoaffective disorder, bipolar type (HCC) 01/09/2002    Past Surgical History:  Procedure Laterality Date  . Left finger pinning    . LEFT HEART CATH AND CORONARY ANGIOGRAPHY N/A 06/27/2016   Procedure: Left Heart Cath and Coronary Angiography;  Surgeon: Yvonne Kendall, MD;  Location: [facility] INVASIVE CV LAB;  Service: Cardiovascular;  Laterality: N/A;  . ORIF left radius          Home Medications    Prior to Admission medications   Medication Sig Start Date End Date Taking? Authorizing Provider  allopurinol (ZYLOPRIM) 300 MG tablet Take 300 mg by mouth daily.    [provider]  apixaban (ELIQUIS) 5 MG TABS tablet Take 1 tablet (5 mg total) by mouth 2 (two) times daily. 06/30/16   Alison Murray, MD  atorvastatin (LIPITOR) 80 MG tablet Take 80 mg by mouth daily.    [provider]  benztropine (COGENTIN) 1 MG tablet Take 1 mg by mouth 2 (two) times daily.    [provider]  diltiazem (CARDIZEM CD) 300 MG 24 hr capsule Take 1 capsule (300 mg total)  by mouth daily. 07/01/16   Alison Murray, MD  docusate sodium (COLACE) 100 MG capsule Take 100 mg by mouth 2 (two) times daily as needed for mild constipation.    [provider]  gabapentin (NEURONTIN) 300 MG capsule Take 300 mg by mouth 3 (three) times daily.    [provider]  isosorbide mononitrate (IMDUR) 30 MG 24 hr tablet Take 30 mg by mouth daily.    [provider]  methocarbamol (ROBAXIN) 500 MG tablet Take 500 mg by mouth 3 (three) times daily.    [provider]  nitroGLYCERIN (NITROSTAT) 0.4 MG SL tablet Place 0.4 mg under the tongue every 5 (five) minutes as needed  for chest pain.    [provider]  oxyCODONE-acetaminophen (PERCOCET/ROXICET) 5-325 MG tablet Take 1-2 tablets by mouth every 4 (four) hours as needed for severe pain. 07/28/16   Melene Plan, DO  Paliperidone Palmitate (INVEGA SUSTENNA) 39 MG/0.25ML SUSP Inject 39 mg into the muscle every 28 (twenty-eight) days.    [provider]  predniSONE (DELTASONE) 20 MG tablet 3 tabs po daily x 4 days 07/28/16   Melene Plan, DO  senna-docusate (SENOKOT-S) 8.6-50 MG tablet Take 1 tablet by mouth 2 (two) times daily as needed for mild constipation.    [provider]  Vitamin D, Ergocalciferol, (DRISDOL) 50000 units CAPS capsule Take 50,000 Units by mouth every 7 (seven) days.    [provider]  zolpidem (AMBIEN) 10 MG tablet Take 1 tablet (10 mg total) by mouth at bedtime as needed for sleep. 06/30/16   Alison Murray, MD    Family History Family History  Problem Relation Age of Onset  . Hyperlipidemia Maternal Grandmother     Social History Social History   Tobacco Use  . Smoking status: Never Smoker  . Smokeless tobacco: Never Used  Substance Use Topics  . Alcohol use: No  . Drug use: Yes    Types: Cocaine     Allergies   Tolmetin; Beta adrenergic blockers; Ibuprofen; Naproxen sodium; Nsaids; Colchicine; Gabapentin; and Morphine and related   Review of Systems Review of Systems  All systems reviewed and negative, other than as noted in HPI.  Physical Exam Updated Vital Signs BP 139/81   Pulse (!) 125   Temp 98.7 F (37.1 C) (Oral)   Resp (!) 23   SpO2 95%   Physical Exam  Constitutional: He appears well-developed and well-nourished. No distress.  Laying in bed. NAD. Obese.   HENT:  Head: Normocephalic and atraumatic.  Eyes: Conjunctivae are normal. Right eye exhibits no discharge. Left eye exhibits no discharge.  Neck: Neck supple.  Cardiovascular: Normal heart sounds. Exam reveals no gallop and no friction rub.  No murmur heard. Tachy.  Irregular.   Pulmonary/Chest: Effort normal and breath sounds normal. No respiratory distress.  Abdominal: Soft. He exhibits no distension. There is no tenderness.  Musculoskeletal: He exhibits no edema or tenderness.  Pain with palpation & range of motion of the first MTP joint.  No significant erythema or swelling.  Neurological: He is alert.  Skin: Skin is warm and dry.  Psychiatric: He has a normal mood and affect. His behavior is normal. Thought content normal.  Nursing note and vitals reviewed.    ED Treatments / Results  Labs (all labs ordered are listed, but only abnormal results are displayed) Labs Reviewed  BASIC METABOLIC PANEL - Abnormal; Notable for the following components:      Result Value   Glucose,  Bld 114 (*)    BUN 30 (*)    Creatinine, Ser 1.88 (*)    GFR calc non Af Amer 37 (*)    GFR calc Af Amer 43 (*)    All other components within normal limits  TROPONIN I - Abnormal; Notable for the following components:   Troponin I 0.05 (*)    All other components within normal limits  CBC WITH DIFFERENTIAL/PLATELET  MAGNESIUM  TROPONIN I  RAPID URINE DRUG SCREEN, HOSP PERFORMED    EKG EKG Interpretation  Date/Time:  Wednesday November 18 2017 09:34:11 EDT Ventricular Rate:  156 PR Interval:    QRS Duration: 93 QT Interval:  298 QTC Calculation: 481 R Axis:   -83 Text Interpretation:  Atrial fibrillation/flutter ST depression, probably rate related Borderline prolonged QT interval Confirmed by Xavier Ford, Xavier Ford [phone] on 11/18/2017 9:46:30 AM   Radiology No results found.  Procedures Procedures (including critical care time)  CRITICAL CARE Performed by: Xavier RazorStephen Camela Wich Total critical care time: 35 minutes Critical care time was exclusive of separately billable procedures and treating other patients. Critical care was necessary to treat or prevent imminent or life-threatening deterioration. Critical care was time spent personally by me on the following  activities: development of treatment plan with patient and/or surrogate as well as nursing, discussions with consultants, evaluation of patient's response to treatment, examination of patient, obtaining history from patient or surrogate, ordering and performing treatments and interventions, ordering and review of laboratory studies, ordering and review of radiographic studies, pulse oximetry and re-evaluation of patient's condition.   Medications Ordered in ED Medications  diltiazem (CARDIZEM) 1 mg/mL load via infusion 20 mg (20 mg Intravenous Bolus from Bag 11/18/17 1027)    And  diltiazem (CARDIZEM) 100 mg in dextrose 5% 100mL (1 mg/mL) infusion (10 mg/hr Intravenous New Bag/Given 11/18/17 1026)  0.9 %  sodium chloride infusion (1,000 mLs Intravenous New Bag/Given 11/18/17 1024)  oxyCODONE-acetaminophen (PERCOCET/ROXICET) 5-325 MG per tablet 2 tablet (2 tablets Oral Given 11/18/17 1241)     Initial Impression / Assessment and Plan / ED Course  I have reviewed the triage vital signs and the nursing notes.  Pertinent labs & imaging results that were available during my care of the patient were reviewed by me and considered in my medical decision making (see chart for details).     62 year old male with generalized weakness, mild dyspnea with exertion and mild left-sided chest discomfort.  He arrived in atrial flutter.  Does describe some left-sided chest discomfort but states that he has consistently felt this previously when in atrial flutter.  Cardiac catheterization last year with nonobstructive CAD.  Initial troponin is minimally elevated with symptoms constant since yesterday.  I suspect that this is more from demand ischemia.  He was started on a Cardizem drip after a bolus.  Rate somewhat improved while at rest but accelerates back in the 150s with any activity.  He adamantly denies any cocaine use, will obtain UDS though.  And for ongoing treatment.  Final Clinical Impressions(s) / ED Diagnoses    Final diagnoses:  Atrial flutter, unspecified type (HCC)  Elevated troponin    ED Discharge Orders    None       Xavier Ford, Alivea Gladson, MD 11/18/17 1311

## 2017-11-18 NOTE — ED Triage Notes (Signed)
Pt reports cp and sob x last night, this am his bp at [facility] was "146/133" per report, they brought him to ed for eval. ekg and iv access obtained while pt being triaged.

## 2017-11-18 NOTE — ED Notes (Signed)
ED Provider at bedside. 

## 2017-11-19 ENCOUNTER — Ambulatory Visit (HOSPITAL_BASED_OUTPATIENT_CLINIC_OR_DEPARTMENT_OTHER): Payer: Medicaid Other

## 2017-11-19 DIAGNOSIS — I4892 Unspecified atrial flutter: Secondary | ICD-10-CM | POA: Diagnosis not present

## 2017-11-19 DIAGNOSIS — I361 Nonrheumatic tricuspid (valve) insufficiency: Secondary | ICD-10-CM

## 2017-11-19 LAB — GLUCOSE, CAPILLARY
GLUCOSE-CAPILLARY: 94 mg/dL (ref 70–99)
Glucose-Capillary: 99 mg/dL (ref 70–99)

## 2017-11-19 LAB — CBC
HEMATOCRIT: 48.2 % (ref 39.0–52.0)
Hemoglobin: 15.9 g/dL (ref 13.0–17.0)
MCH: 30.1 pg (ref 26.0–34.0)
MCHC: 33 g/dL (ref 30.0–36.0)
MCV: 91.3 fL (ref 78.0–100.0)
Platelets: 301 10*3/uL (ref 150–400)
RBC: 5.28 MIL/uL (ref 4.22–5.81)
RDW: 13.8 % (ref 11.5–15.5)
WBC: 8.1 10*3/uL (ref 4.0–10.5)

## 2017-11-19 LAB — ECHOCARDIOGRAM COMPLETE
Height: 71 in
Weight: 4162.28 oz

## 2017-11-19 LAB — HIV ANTIBODY (ROUTINE TESTING W REFLEX): HIV Screen 4th Generation wRfx: NONREACTIVE

## 2017-11-19 LAB — BASIC METABOLIC PANEL
ANION GAP: 14 (ref 5–15)
BUN: 31 mg/dL — ABNORMAL HIGH (ref 8–23)
CHLORIDE: 95 mmol/L — AB (ref 98–111)
CO2: 29 mmol/L (ref 22–32)
Calcium: 9.3 mg/dL (ref 8.9–10.3)
Creatinine, Ser: 1.95 mg/dL — ABNORMAL HIGH (ref 0.61–1.24)
GFR calc non Af Amer: 35 mL/min — ABNORMAL LOW (ref >60)
GFR, EST AFRICAN AMERICAN: 41 mL/min — AB (ref >60)
Glucose, Bld: 99 mg/dL (ref 70–99)
POTASSIUM: 4.4 mmol/L (ref 3.5–5.1)
Sodium: 138 mmol/L (ref 135–145)

## 2017-11-19 LAB — TROPONIN I: TROPONIN I: 0.03 ng/mL — AB (ref <0.03)

## 2017-11-19 MED ORDER — PANTOPRAZOLE SODIUM 40 MG PO TBEC: 40.0000 mg | DELAYED_RELEASE_TABLET | Freq: Every day | ORAL | 0 refills | Status: DC

## 2017-11-19 MED ORDER — OXYCODONE-ACETAMINOPHEN 5-325 MG PO TABS: 1.0000 | ORAL_TABLET | Freq: Two times a day (BID) | ORAL | 0 refills | Status: DC | PRN

## 2017-11-19 MED ORDER — SENNOSIDES-DOCUSATE SODIUM 8.6-50 MG PO TABS
1.0000 | ORAL_TABLET | Freq: Two times a day (BID) | ORAL | Status: DC
  Administered 2017-11-19: 1 via ORAL
  Filled 2017-11-19 (×2): qty 1

## 2017-11-19 MED ORDER — SENNOSIDES-DOCUSATE SODIUM 8.6-50 MG PO TABS: 1.0000 | ORAL_TABLET | Freq: Every evening | ORAL | Status: DC | PRN

## 2017-11-19 MED ORDER — ACETAMINOPHEN 500 MG PO TABS: 1000.0000 mg | ORAL_TABLET | Freq: Three times a day (TID) | ORAL | 0 refills | Status: AC | PRN

## 2017-11-19 MED ORDER — PANTOPRAZOLE SODIUM 40 MG PO TBEC: 40.0000 mg | DELAYED_RELEASE_TABLET | Freq: Every day | ORAL | 0 refills | Status: AC

## 2017-11-19 MED ORDER — DILTIAZEM HCL ER COATED BEADS 180 MG PO CP24: 360.0000 mg | ORAL_CAPSULE | Freq: Every day | ORAL | Status: DC

## 2017-11-19 MED ORDER — BLOOD GLUCOSE MONITOR KIT: PACK | 0 refills | Status: AC

## 2017-11-19 MED ORDER — OXYCODONE-ACETAMINOPHEN 5-325 MG PO TABS: 1.0000 | ORAL_TABLET | Freq: Two times a day (BID) | ORAL | Status: DC | PRN

## 2017-11-19 NOTE — Progress Notes (Signed)
  Echocardiogram 2D Echocardiogram has been performed.  Xavier PartridgeBrooke S Melissa Pulido 11/19/2017, 11:59 AM

## 2017-11-19 NOTE — Discharge Summary (Signed)
Physician Discharge Summary  Xavier Ford:[id number] DOB: 28-Apr-1955 DOA: 11/18/2017  PCP: Xavier Schneiders, MD  Admit date: 11/18/2017 Discharge date: 11/19/2017  Admitted From: [facility] Discharge disposition: [facility]   Recommendations for Outpatient Follow-Up:   1. Monitoring of blood sugars-- plan to follow diabetic diet before starting any medications 2. BMP 1 week with PCP-- adjust meds 3. Outpatient sleep study   Discharge Diagnosis:   Active Problems:   Atrial flutter with rapid ventricular response ([place])    Discharge Condition: Improved.  Diet recommendation: Low sodium, heart healthy.  Carbohydrate-modified  Wound care: None.  Code status: Full.   History of Present Illness:   Xavier Ford is a 62 y.o. male with medical history significant of a fib, drug abuse, gout and CKD stage III. Yesterday was in his usual state of health when he developed some left sided chest pain, SOB and felt like his BP was increased.  He was brought to the ER and found to be in a fib with RVR (HR 140s).  He was started on a cardizem gtt and it appears that HR improved and he is back in sinus.   Troponin was elevated at 0.05 and is flat.  No chest pain currently.  Patient states this has happened to him in the past He denies fever and chills.  Denies recent drug use.   He has a heart cath in 2018. Does have gout and has had numerous recent flares   Hospital Course by Problem:   A fib with RVR -flat troponin -echo 2D:  - LVEF 65-70%, moderate LVH, normal wall motion, grade 1 DD,   indeterminate LV filling pressure, trivial MR, normal biatrial   size, mild TR, RVSP 27 mmHg, normal IVC. -restart home medications  CAD -last cath 2018 -non-obstructive disease  H/o cocaine abuse -last use 30 days ago  CKD III -avoid nephrotoxic drugs  Reported impaired glucose tolerance  HgbA1C: 6.5 Diabetic diet  OSA h/o -needs formal outpatient testing-- defer to  PCP  Gout Allopurinol 300 mg daily Prednisone 10 mg-- not sure how long this is planned to continue -cannot have NSAID due to CKD -will try to control pain with tylenol  Obesity Body mass index is 36.28 kg/m    Medical Consultants:      Discharge Exam:   Vitals:   11/19/17 0900 11/19/17 1100  BP:  136/79  Pulse: 71 (!) 46  Resp:  18  Temp:  98.3 F (36.8 C)  SpO2: 95% 96%   Vitals:   11/19/17 0300 11/19/17 0853 11/19/17 0900 11/19/17 1100  BP: (!) 143/83 (!) 139/99  136/79  Pulse: 61 78 71 (!) 46  Resp: 18 18  18   Temp: (!) 96.9 F (36.1 C)   98.3 F (36.8 C)  TempSrc: Axillary   Oral  SpO2: 99%  95% 96%  Weight:      Height:        General exam: Appears calm and comfortable. No chest pain, no SOB   The results of significant diagnostics from this hospitalization (including imaging, microbiology, ancillary and laboratory) are listed below for reference.     Procedures and Diagnostic Studies:   No results found.   Labs:   Basic Metabolic Panel: Recent Labs  Lab 11/18/17 0922 11/19/17 0511  NA 137 138  K 3.7 4.4  CL 100 95*  CO2 27 29  GLUCOSE 114* 99  BUN 30* 31*  CREATININE 1.88* 1.95*  CALCIUM 9.1 9.3  MG 2.4  --    GFR Estimated Creatinine Clearance: 51.3 mL/min (A) (by C-G formula based on SCr of 1.95 mg/dL (H)). Liver Function Tests: No results for input(s): AST, ALT, ALKPHOS, BILITOT, PROT, ALBUMIN in the last 168 hours. No results for input(s): LIPASE, AMYLASE in the last 168 hours. No results for input(s): AMMONIA in the last 168 hours. Coagulation profile No results for input(s): INR, PROTIME in the last 168 hours.  CBC: Recent Labs  Lab 11/18/17 0922 11/19/17 0511  WBC 9.4 8.1  NEUTROABS 6.1  --   HGB 16.6 15.9  HCT 47.8 48.2  MCV 87.9 91.3  PLT 295 301   Cardiac Enzymes: Recent Labs  Lab 11/18/17 0922 11/18/17 1304 11/18/17 1815 11/19/17 0511  TROPONINI 0.05* 0.05* 0.03* 0.03*   BNP: Invalid input(s):  POCBNP CBG: Recent Labs  Lab 11/18/17 2117 11/19/17 0758 11/19/17 1158  GLUCAP 199* 94 99   D-Dimer No results for input(s): DDIMER in the last 72 hours. Hgb A1c Recent Labs    11/18/17 1815  HGBA1C 6.5*   Lipid Profile No results for input(s): CHOL, HDL, LDLCALC, TRIG, CHOLHDL, LDLDIRECT in the last 72 hours. Thyroid function studies Recent Labs    11/18/17 1815  TSH 1.419   Anemia work up No results for input(s): VITAMINB12, FOLATE, FERRITIN, TIBC, IRON, RETICCTPCT in the last 72 hours. Microbiology Recent Results (from the past 240 hour(s))  MRSA PCR Screening     Status: None   Collection Time: 11/18/17  4:00 PM  Result Value Ref Range Status   MRSA by PCR NEGATIVE NEGATIVE Final    Comment:        The GeneXpert MRSA Assay (FDA approved for NASAL specimens only), is one component of a comprehensive MRSA colonization surveillance program. It is not intended to diagnose MRSA infection nor to guide or monitor treatment for MRSA infections. Performed at [facility], [place] [address] [place], [place] [address]      Discharge Instructions:   Discharge Instructions    Diet - low sodium heart healthy   Complete by:  As directed    Diet Carb Modified   Complete by:  As directed    Increase activity slowly   Complete by:  As directed      Allergies as of 11/19/2017      Reactions   Morphine Itching   Tolmetin Hives, Swelling   Beta Adrenergic Blockers Other (See Comments)   Cocaine Abuse   Ibuprofen Hives   Naproxen Sodium Hives   Nsaids Hives   Colchicine Rash   Patient states that he was told to stay off of this med due to "kidney problems"   Gabapentin    Other reaction(s): Cough (ALLERGY/intolerance), GI Upset (intolerance) Patient states after taking the medication, his throat started swelling, he had a horrible headache and abd cramping.   Morphine And Related Itching      Medication List    STOP taking these medications   hydrALAZINE  25 MG tablet Commonly known as:  APRESOLINE   oxyCODONE-acetaminophen 5-325 MG tablet Commonly known as:  PERCOCET/ROXICET   zolpidem 10 MG tablet Commonly known as:  AMBIEN     TAKE these medications   acetaminophen 500 MG tablet Commonly known as:  TYLENOL Take 2 tablets (1,000 mg total) by mouth every 8 (eight) hours as needed for mild pain. Max dose daily of tylenol is 4 grams What changed:    how much to take  additional instructions  allopurinol 300 MG tablet Commonly known as:  ZYLOPRIM Take 300 mg by mouth daily.   apixaban 5 MG Tabs tablet Commonly known as:  ELIQUIS Take 1 tablet (5 mg total) by mouth 2 (two) times daily.   blood glucose meter kit and supplies Kit Dispense based on patient and insurance preference. Use up to four times daily as directed. (FOR ICD-9 250.00, 250.01).   chlorthalidone 25 MG tablet Commonly known as:  HYGROTON Take 25 mg by mouth daily.   diltiazem 360 MG 24 hr capsule Commonly known as:  CARDIZEM CD Take 360 mg by mouth daily. What changed:  Another medication with the same name was removed. Continue taking this medication, and follow the directions you see here.   doxepin 25 MG capsule Commonly known as:  SINEQUAN Take 25 mg by mouth daily.   isosorbide mononitrate 30 MG 24 hr tablet Commonly known as:  IMDUR Take 30 mg by mouth daily.   pantoprazole 40 MG tablet Commonly known as:  PROTONIX Take 1 tablet (40 mg total) by mouth daily. Start taking on:  11/20/2017   polyethylene glycol packet Commonly known as:  MIRALAX / GLYCOLAX Take 17 g by mouth daily.   predniSONE 10 MG tablet Commonly known as:  DELTASONE Take 10 mg by mouth daily.   rosuvastatin 10 MG tablet Commonly known as:  CRESTOR Take 10 mg by mouth every evening.   senna-docusate 8.6-50 MG tablet Commonly known as:  Senokot-S Take 1 tablet by mouth at bedtime as needed for mild constipation.      Follow-up Information    Xavier Schneiders, MD  Follow up.   Why:  will need outpatient sleep study Contact information: [place] [address] [phone]            Time coordinating discharge: 25 min  Signed:  Geradine Girt  [facility] 11/19/2017, 2:42 PM

## 2017-11-19 NOTE — Care Management Note (Signed)
Case Management Note  Patient Details  Name: Xavier RenshawJoseph L Ford MRN: [id number] Date of Birth: 1956/02/14  Subjective/Objective:       Pt in with afib RVR. [facility].              Action/Plan: [facility] They are providing transportation back to the rehab arranged for 4:30 pm.  Pt with one medication prescription (protonix). [facility] for 2 weeks worth of medication. Medication in patients d/c packet. Pt also has prescription for glucose meter and protonix for [facility].  [facility]: [phone].   Expected Discharge Date:  11/19/17               Expected Discharge Plan:  OP Rehab  In-House Referral:     Discharge planning Services  CM Consult  Post Acute Care Choice:    Choice offered to:     DME Arranged:    DME Agency:     HH Arranged:    HH Agency:     Status of Service:  Completed, signed off  If discussed at [employer] Length of Stay Meetings, dates discussed:    Additional Comments:  Kermit BaloKelli F Donn Wilmot, RN 11/19/2017, 4:27 PM

## 2017-11-19 NOTE — Progress Notes (Signed)
Removed PIV access and case manager arranged [facility] to pick him up. Patient received the discharge instructions. Pt took medication of protonix and [facility] pick him up.

## 2017-11-22 ENCOUNTER — Encounter (HOSPITAL_BASED_OUTPATIENT_CLINIC_OR_DEPARTMENT_OTHER): Payer: Self-pay | Admitting: Emergency Medicine

## 2017-11-22 ENCOUNTER — Other Ambulatory Visit: Payer: Self-pay

## 2017-11-22 ENCOUNTER — Emergency Department (HOSPITAL_BASED_OUTPATIENT_CLINIC_OR_DEPARTMENT_OTHER): Payer: Medicaid Other

## 2017-11-22 ENCOUNTER — Inpatient Hospital Stay (HOSPITAL_BASED_OUTPATIENT_CLINIC_OR_DEPARTMENT_OTHER)
Admission: EM | Admit: 2017-11-22 | Discharge: 2017-11-24 | DRG: 310 | Disposition: A | Discharge status: Home / Self Care | Payer: Medicaid Other | Attending: Internal Medicine | Admitting: Internal Medicine

## 2017-11-22 DIAGNOSIS — Z7952 Long term (current) use of systemic steroids: Secondary | ICD-10-CM | POA: Diagnosis not present

## 2017-11-22 DIAGNOSIS — Z7151 Drug abuse counseling and surveillance of drug abuser: Secondary | ICD-10-CM | POA: Diagnosis not present

## 2017-11-22 DIAGNOSIS — R748 Abnormal levels of other serum enzymes: Secondary | ICD-10-CM | POA: Diagnosis not present

## 2017-11-22 DIAGNOSIS — Z886 Allergy status to analgesic agent status: Secondary | ICD-10-CM

## 2017-11-22 DIAGNOSIS — Z888 Allergy status to other drugs, medicaments and biological substances status: Secondary | ICD-10-CM

## 2017-11-22 DIAGNOSIS — Z6836 Body mass index (BMI) 36.0-36.9, adult: Secondary | ICD-10-CM | POA: Diagnosis not present

## 2017-11-22 DIAGNOSIS — E785 Hyperlipidemia, unspecified: Secondary | ICD-10-CM | POA: Diagnosis present

## 2017-11-22 DIAGNOSIS — F259 Schizoaffective disorder, unspecified: Secondary | ICD-10-CM | POA: Diagnosis not present

## 2017-11-22 DIAGNOSIS — R7989 Other specified abnormal findings of blood chemistry: Secondary | ICD-10-CM | POA: Diagnosis not present

## 2017-11-22 DIAGNOSIS — Z8349 Family history of other endocrine, nutritional and metabolic diseases: Secondary | ICD-10-CM

## 2017-11-22 DIAGNOSIS — I4892 Unspecified atrial flutter: Secondary | ICD-10-CM | POA: Diagnosis not present

## 2017-11-22 DIAGNOSIS — I10 Essential (primary) hypertension: Secondary | ICD-10-CM | POA: Diagnosis not present

## 2017-11-22 DIAGNOSIS — I480 Paroxysmal atrial fibrillation: Secondary | ICD-10-CM | POA: Diagnosis not present

## 2017-11-22 DIAGNOSIS — E669 Obesity, unspecified: Secondary | ICD-10-CM | POA: Diagnosis present

## 2017-11-22 DIAGNOSIS — N183 Chronic kidney disease, stage 3 unspecified: Secondary | ICD-10-CM | POA: Diagnosis present

## 2017-11-22 DIAGNOSIS — I129 Hypertensive chronic kidney disease with stage 1 through stage 4 chronic kidney disease, or unspecified chronic kidney disease: Secondary | ICD-10-CM | POA: Diagnosis present

## 2017-11-22 DIAGNOSIS — Z885 Allergy status to narcotic agent status: Secondary | ICD-10-CM

## 2017-11-22 DIAGNOSIS — E1122 Type 2 diabetes mellitus with diabetic chronic kidney disease: Secondary | ICD-10-CM | POA: Diagnosis not present

## 2017-11-22 DIAGNOSIS — I2510 Atherosclerotic heart disease of native coronary artery without angina pectoris: Secondary | ICD-10-CM | POA: Diagnosis present

## 2017-11-22 DIAGNOSIS — I483 Typical atrial flutter: Secondary | ICD-10-CM | POA: Diagnosis not present

## 2017-11-22 DIAGNOSIS — M109 Gout, unspecified: Secondary | ICD-10-CM | POA: Diagnosis not present

## 2017-11-22 DIAGNOSIS — Z79899 Other long term (current) drug therapy: Secondary | ICD-10-CM | POA: Diagnosis not present

## 2017-11-22 DIAGNOSIS — G4733 Obstructive sleep apnea (adult) (pediatric): Secondary | ICD-10-CM | POA: Diagnosis present

## 2017-11-22 DIAGNOSIS — K219 Gastro-esophageal reflux disease without esophagitis: Secondary | ICD-10-CM | POA: Diagnosis present

## 2017-11-22 DIAGNOSIS — Z7901 Long term (current) use of anticoagulants: Secondary | ICD-10-CM | POA: Diagnosis not present

## 2017-11-22 DIAGNOSIS — F1410 Cocaine abuse, uncomplicated: Secondary | ICD-10-CM | POA: Diagnosis present

## 2017-11-22 DIAGNOSIS — R778 Other specified abnormalities of plasma proteins: Secondary | ICD-10-CM | POA: Diagnosis present

## 2017-11-22 HISTORY — DX: Type 2 diabetes mellitus without complications: E11.9

## 2017-11-22 LAB — RAPID URINE DRUG SCREEN, HOSP PERFORMED
Amphetamines: NOT DETECTED
Barbiturates: NOT DETECTED
Benzodiazepines: NOT DETECTED
Cocaine: NOT DETECTED
OPIATES: NOT DETECTED
Tetrahydrocannabinol: NOT DETECTED

## 2017-11-22 LAB — CBC WITH DIFFERENTIAL/PLATELET
BASOS ABS: 0.1 10*3/uL (ref 0.0–0.1)
BASOS PCT: 1 %
EOS PCT: 1 %
Eosinophils Absolute: 0.1 10*3/uL (ref 0.0–0.7)
HCT: 50.6 % (ref 39.0–52.0)
Hemoglobin: 17.3 g/dL — ABNORMAL HIGH (ref 13.0–17.0)
LYMPHS PCT: 19 %
Lymphs Abs: 1.7 10*3/uL (ref 0.7–4.0)
MCH: 30.6 pg (ref 26.0–34.0)
MCHC: 34.2 g/dL (ref 30.0–36.0)
MCV: 89.4 fL (ref 78.0–100.0)
Monocytes Absolute: 0.7 10*3/uL (ref 0.1–1.0)
Monocytes Relative: 8 %
Neutro Abs: 6.5 10*3/uL (ref 1.7–7.7)
Neutrophils Relative %: 71 %
PLATELETS: 301 10*3/uL (ref 150–400)
RBC: 5.66 MIL/uL (ref 4.22–5.81)
RDW: 14.2 % (ref 11.5–15.5)
WBC: 9 10*3/uL (ref 4.0–10.5)

## 2017-11-22 LAB — COMPREHENSIVE METABOLIC PANEL
ALBUMIN: 4.4 g/dL (ref 3.5–5.0)
ALK PHOS: 76 U/L (ref 38–126)
ALT: 23 U/L (ref 0–44)
AST: 29 U/L (ref 15–41)
Anion gap: 11 (ref 5–15)
BILIRUBIN TOTAL: 0.5 mg/dL (ref 0.3–1.2)
BUN: 30 mg/dL — AB (ref 8–23)
CO2: 26 mmol/L (ref 22–32)
Calcium: 9.3 mg/dL (ref 8.9–10.3)
Chloride: 100 mmol/L (ref 98–111)
Creatinine, Ser: 1.72 mg/dL — ABNORMAL HIGH (ref 0.61–1.24)
GFR calc Af Amer: 47 mL/min — ABNORMAL LOW (ref >60)
GFR calc non Af Amer: 41 mL/min — ABNORMAL LOW (ref >60)
Glucose, Bld: 156 mg/dL — ABNORMAL HIGH (ref 70–99)
POTASSIUM: 4.2 mmol/L (ref 3.5–5.1)
Sodium: 137 mmol/L (ref 135–145)
TOTAL PROTEIN: 8.2 g/dL — AB (ref 6.5–8.1)

## 2017-11-22 LAB — TROPONIN I
TROPONIN I: 0.04 ng/mL — AB (ref <0.03)
Troponin I: <0.03 ng/mL (ref <0.03)

## 2017-11-22 LAB — GLUCOSE, CAPILLARY: GLUCOSE-CAPILLARY: 206 mg/dL — AB (ref 70–99)

## 2017-11-22 MED ORDER — PANTOPRAZOLE SODIUM 40 MG PO TBEC
40.0000 mg | DELAYED_RELEASE_TABLET | Freq: Every day | ORAL | Status: DC
  Administered 2017-11-23 – 2017-11-24 (×2): 40 mg via ORAL
  Filled 2017-11-22 (×2): qty 1

## 2017-11-22 MED ORDER — NITROGLYCERIN 2 % TD OINT
1.0000 [in_us] | TOPICAL_OINTMENT | Freq: Three times a day (TID) | TRANSDERMAL | Status: DC
Start: 2017-11-22 — End: 2017-11-24
  Administered 2017-11-22 – 2017-11-24 (×6): 1 [in_us] via TOPICAL
  Filled 2017-11-22: qty 30

## 2017-11-22 MED ORDER — APIXABAN 5 MG PO TABS
5.0000 mg | ORAL_TABLET | Freq: Two times a day (BID) | ORAL | Status: DC
  Administered 2017-11-22 – 2017-11-24 (×4): 5 mg via ORAL
  Filled 2017-11-22 (×4): qty 1

## 2017-11-22 MED ORDER — SODIUM CHLORIDE 0.9 % IV SOLN
250.0000 mL | INTRAVENOUS | Status: DC | PRN
Start: 2017-11-22 — End: 2017-11-24
  Administered 2017-11-23: 250 mL via INTRAVENOUS

## 2017-11-22 MED ORDER — DOXEPIN HCL 25 MG PO CAPS
25.0000 mg | ORAL_CAPSULE | Freq: Every day | ORAL | Status: DC
  Administered 2017-11-23 – 2017-11-24 (×2): 25 mg via ORAL
  Filled 2017-11-22 (×2): qty 1

## 2017-11-22 MED ORDER — ACETAMINOPHEN 325 MG PO TABS: 650.0000 mg | ORAL_TABLET | Freq: Four times a day (QID) | ORAL | Status: DC | PRN

## 2017-11-22 MED ORDER — PREDNISONE 10 MG PO TABS
10.0000 mg | ORAL_TABLET | Freq: Every day | ORAL | Status: DC
  Administered 2017-11-23 – 2017-11-24 (×2): 10 mg via ORAL
  Filled 2017-11-22 (×2): qty 1

## 2017-11-22 MED ORDER — SENNOSIDES-DOCUSATE SODIUM 8.6-50 MG PO TABS: 1.0000 | ORAL_TABLET | Freq: Every evening | ORAL | Status: DC | PRN

## 2017-11-22 MED ORDER — POLYETHYLENE GLYCOL 3350 17 G PO PACK
17.0000 g | PACK | Freq: Every day | ORAL | Status: DC
  Administered 2017-11-23 – 2017-11-24 (×2): 17 g via ORAL
  Filled 2017-11-22 (×2): qty 1

## 2017-11-22 MED ORDER — SODIUM CHLORIDE 0.9% FLUSH
3.0000 mL | Freq: Two times a day (BID) | INTRAVENOUS | Status: DC
  Administered 2017-11-22 – 2017-11-24 (×4): 3 mL via INTRAVENOUS

## 2017-11-22 MED ORDER — INSULIN ASPART 100 UNIT/ML ~~LOC~~ SOLN
0.0000 [IU] | Freq: Every day | SUBCUTANEOUS | Status: DC
  Administered 2017-11-22 – 2017-11-23 (×2): 2 [IU] via SUBCUTANEOUS

## 2017-11-22 MED ORDER — ISOSORBIDE MONONITRATE ER 30 MG PO TB24
30.0000 mg | ORAL_TABLET | Freq: Every day | ORAL | Status: DC
  Administered 2017-11-23 – 2017-11-24 (×2): 30 mg via ORAL
  Filled 2017-11-22 (×2): qty 1

## 2017-11-22 MED ORDER — ALLOPURINOL 300 MG PO TABS
300.0000 mg | ORAL_TABLET | Freq: Every day | ORAL | Status: DC
  Administered 2017-11-23 – 2017-11-24 (×2): 300 mg via ORAL
  Filled 2017-11-22 (×2): qty 1

## 2017-11-22 MED ORDER — TRAMADOL HCL 50 MG PO TABS
50.0000 mg | ORAL_TABLET | Freq: Four times a day (QID) | ORAL | Status: DC | PRN
  Administered 2017-11-22: 50 mg via ORAL
  Filled 2017-11-22: qty 1

## 2017-11-22 MED ORDER — ACETAMINOPHEN 650 MG RE SUPP: 650.0000 mg | Freq: Four times a day (QID) | RECTAL | Status: DC | PRN

## 2017-11-22 MED ORDER — DILTIAZEM LOAD VIA INFUSION
10.0000 mg | Freq: Once | INTRAVENOUS | Status: AC
  Administered 2017-11-22: 10 mg via INTRAVENOUS
  Filled 2017-11-22: qty 10

## 2017-11-22 MED ORDER — DILTIAZEM HCL-DEXTROSE 100-5 MG/100ML-% IV SOLN (PREMIX)
5.0000 mg/h | INTRAVENOUS | Status: DC
  Administered 2017-11-23: 10 mg/h via INTRAVENOUS
  Administered 2017-11-23: 5 mg/h via INTRAVENOUS
  Administered 2017-11-24: 10 mg/h via INTRAVENOUS
  Filled 2017-11-22 (×5): qty 100

## 2017-11-22 MED ORDER — INSULIN ASPART 100 UNIT/ML ~~LOC~~ SOLN: 0.0000 [IU] | Freq: Three times a day (TID) | SUBCUTANEOUS | Status: DC

## 2017-11-22 MED ORDER — CHLORTHALIDONE 25 MG PO TABS
25.0000 mg | ORAL_TABLET | Freq: Every day | ORAL | Status: DC
  Administered 2017-11-23 – 2017-11-24 (×2): 25 mg via ORAL
  Filled 2017-11-22 (×2): qty 1

## 2017-11-22 MED ORDER — ROSUVASTATIN CALCIUM 10 MG PO TABS
10.0000 mg | ORAL_TABLET | Freq: Every evening | ORAL | Status: DC
  Administered 2017-11-22 – 2017-11-23 (×2): 10 mg via ORAL
  Filled 2017-11-22 (×2): qty 1

## 2017-11-22 MED ORDER — SODIUM CHLORIDE 0.9% FLUSH: 3.0000 mL | INTRAVENOUS | Status: DC | PRN

## 2017-11-22 NOTE — Progress Notes (Signed)
ANTICOAGULATION CONSULT NOTE - Initial Consult  Pharmacy Consult for Eliquis Indication: atrial fibrillation  Allergies  Allergen Reactions  . Morphine Itching  . Tolmetin Hives and Swelling  . Beta Adrenergic Blockers Other (See Comments)    Cocaine Abuse  . Ibuprofen Hives  . Naproxen Sodium Hives  . Nsaids Hives  . Colchicine Rash    Patient states that he was told to stay off of this med due to "kidney problems"  . Gabapentin     Other reaction(s): Cough (ALLERGY/intolerance), GI Upset (intolerance) Patient states after taking the medication, his throat started swelling, he had a horrible headache and abd cramping.  . Morphine And Related Itching    Patient Measurements: Height: 5' 11"  (180.3 cm) Weight: 260 lb 6.4 oz (118.1 kg) IBW/kg (Calculated) : 75.3  Vital Signs: Temp: 97.5 F (36.4 C) (08/11 2059) Temp Source: Oral (08/11 2056) BP: 175/79 (08/11 2215) Pulse Rate: 43 (08/11 2215)  Labs: Recent Labs    11/22/17 1419 11/22/17 1450  HGB 17.3*  --   HCT 50.6  --   PLT 301  --   CREATININE  --  1.72*  TROPONINI 0.04*  --     Estimated Creatinine Clearance: 58.2 mL/min (A) (by C-G formula based on SCr of 1.72 mg/dL (H)).   Medical History: Past Medical History:  Diagnosis Date  . Atrial fibrillation ([place])   . CKD (chronic kidney disease), stage III ([place])   . Diabetes ([place])   . Gout   . Hypertension   . Schizoaffective disorder (HCC)     Medications:  Medications Prior to Admission  Medication Sig Dispense Refill Last Dose  . acetaminophen (TYLENOL) 500 MG tablet Take 2 tablets (1,000 mg total) by mouth every 8 (eight) hours as needed for mild pain. Max dose daily of tylenol is 4 grams 30 tablet 0   . allopurinol (ZYLOPRIM) 300 MG tablet Take 300 mg by mouth daily.   11/18/2017 at Unknown time  . apixaban (ELIQUIS) 5 MG TABS tablet Take 1 tablet (5 mg total) by mouth 2 (two) times daily. 60 tablet 0 11/18/2017 at 0800  . blood glucose meter kit and  supplies KIT Dispense based on patient and insurance preference. Use up to four times daily as directed. (FOR ICD-9 250.00, 250.01). 1 each 0   . chlorthalidone (HYGROTON) 25 MG tablet Take 25 mg by mouth daily.   11/18/2017 at Unknown time  . diltiazem (CARDIZEM CD) 360 MG 24 hr capsule Take 360 mg by mouth daily.   11/18/2017 at Unknown time  . doxepin (SINEQUAN) 25 MG capsule Take 25 mg by mouth daily.   11/18/2017 at Unknown time  . isosorbide mononitrate (IMDUR) 30 MG 24 hr tablet Take 30 mg by mouth daily.   11/18/2017 at Unknown time  . pantoprazole (PROTONIX) 40 MG tablet Take 1 tablet (40 mg total) by mouth daily. While on steroids 30 tablet 0   . polyethylene glycol (MIRALAX / GLYCOLAX) packet Take 17 g by mouth daily.   11/18/2017 at Unknown time  . predniSONE (DELTASONE) 10 MG tablet Take 10 mg by mouth daily.   11/18/2017 at Unknown time  . rosuvastatin (CRESTOR) 10 MG tablet Take 10 mg by mouth every evening.   11/17/2017 at Unknown time  . senna-docusate (SENOKOT-S) 8.6-50 MG tablet Take 1 tablet by mouth at bedtime as needed for mild constipation.       Med Hx pending: Pt on Eliquis PTA - last dose 8/11 AM  Assessment: 62  yo M presented to [facility] with elevated BP, slight dyspnea.  EKG shows afib/flutter.  Trop 0.04.  Transferred to [facility] for management.  To resume Eliquis from PTA.  Goal of Therapy:  therapeutic anticoagulation Monitor platelets by anticoagulation protocol: Yes   Plan:  Eliquis 87m PO BID Monitor for signs/sx of bleed.  [employer] Pharm.D., BCPS Clinical Pharmacist  **Pharmacist phone directory can now be found on [email or web address] ([email or web address]).  Listed under [place]  11/22/2017 10:49 PM

## 2017-11-22 NOTE — ED Notes (Signed)
Informed ED MD of request for pain medicine for back and toe. States," But forget it if he orders Tylenol"

## 2017-11-22 NOTE — ED Provider Notes (Addendum)
[facility] EMERGENCY DEPARTMENT Provider Note   CSN: [id number] Arrival date & time: 11/22/17  1153     History   Chief Complaint Chief Complaint  Patient presents with  . Hypertension    HPI Xavier Ford is a 62 y.o. male.  HPI [facility] with reported [facility].  Reportedly sent in because her blood pressure was too high.  Does not know what his number was but states because the lower number was too high.  Does have history of hypertension.  Does have slight left-sided chest pain.  Recent admission to the hospital for a flutter with RVR.  States his a flutter has been coming and going.  Is on anticoagulation.  Also complaining of pain in his left great toe.  History of gout. Past Medical History:  Diagnosis Date  . Atrial fibrillation ([place])   . CKD (chronic kidney disease), stage III ([place])   . Gout   . Hypertension   . Schizoaffective disorder [facility])     Patient Active Problem List   Diagnosis Date Noted  . Atrial flutter with rapid ventricular response ([place]) 11/18/2017  . CAD (coronary artery disease) 07/25/2016  . Chronic anticoagulation 07/25/2016  . Unstable angina ([place]) 06/27/2016  . PAF (paroxysmal atrial fibrillation) ([place]) 06/27/2016  . CKD (chronic kidney disease) stage 3, GFR 30-59 ml/min (HCC) 02/18/2016  . Gout 01/06/2015  . Essential hypertension 01/06/2015  . Polysubstance abuse ([place]) 01/10/2011  . Schizoaffective disorder, bipolar type ([place]) 01/09/2002    Past Surgical History:  Procedure Laterality Date  . Left finger pinning    . LEFT HEART CATH AND CORONARY ANGIOGRAPHY N/A 06/27/2016   Procedure: Left Heart Cath and Coronary Angiography;  Surgeon: Nelva Bush, MD;  Location: [facility];  Service: Cardiovascular;  Laterality: N/A;  . ORIF left radius          Home Medications    Prior to Admission medications   Medication Sig Start Date End Date Taking? Authorizing Provider  acetaminophen  (TYLENOL) 500 MG tablet Take 2 tablets (1,000 mg total) by mouth every 8 (eight) hours as needed for mild pain. Max dose daily of tylenol is 4 grams 11/19/17   Eulogio Bear U, DO  allopurinol (ZYLOPRIM) 300 MG tablet Take 300 mg by mouth daily.    [provider]  apixaban (ELIQUIS) 5 MG TABS tablet Take 1 tablet (5 mg total) by mouth 2 (two) times daily. 06/30/16   Robbie Lis, MD  blood glucose meter kit and supplies KIT Dispense based on patient and insurance preference. Use up to four times daily as directed. (FOR ICD-9 250.00, 250.01). 11/19/17   Geradine Girt, DO  chlorthalidone (HYGROTON) 25 MG tablet Take 25 mg by mouth daily.    [provider]  diltiazem (CARDIZEM CD) 360 MG 24 hr capsule Take 360 mg by mouth daily.    [provider]  doxepin (SINEQUAN) 25 MG capsule Take 25 mg by mouth daily.    [provider]  isosorbide mononitrate (IMDUR) 30 MG 24 hr tablet Take 30 mg by mouth daily.    [provider]  pantoprazole (PROTONIX) 40 MG tablet Take 1 tablet (40 mg total) by mouth daily. While on steroids 11/20/17   Geradine Girt, DO  polyethylene glycol (MIRALAX / GLYCOLAX) packet Take 17 g by mouth daily.    [provider]  predniSONE (DELTASONE) 10 MG tablet Take 10 mg by mouth daily.    [provider]  rosuvastatin (CRESTOR) 10 MG tablet Take 10 mg by mouth every evening.    [provider]  senna-docusate (SENOKOT-S) 8.6-50 MG tablet Take 1 tablet by mouth at bedtime as needed for mild constipation. 11/19/17   Geradine Girt, DO    Family History Family History  Problem Relation Age of Onset  . Hyperlipidemia Maternal Grandmother     Social History Social History   Tobacco Use  . Smoking status: Never Smoker  . Smokeless tobacco: Never Used  Substance Use Topics  . Alcohol use: No  . Drug use: Yes    Types: Cocaine     Allergies   Morphine; Tolmetin; Beta adrenergic blockers; Ibuprofen;  Naproxen sodium; Nsaids; Colchicine; Gabapentin; and Morphine and related   Review of Systems Review of Systems  Constitutional: Negative for appetite change.  HENT: Negative for dental problem.   Cardiovascular: Positive for chest pain and palpitations.  Gastrointestinal: Negative for abdominal pain.  Genitourinary: Negative for flank pain.  Musculoskeletal: Negative for back pain.       Toe pain.  Skin: Negative for rash.  Neurological: Negative for weakness.  Psychiatric/Behavioral: Negative for behavioral problems and confusion.     Physical Exam Updated Vital Signs BP (!) 153/109   Pulse 62   Temp 98.5 F (36.9 C) (Oral)   Resp (!) 24   Ht 5' 11"  (1.803 m)   Wt 118 kg   SpO2 98%   BMI 36.28 kg/m   Physical Exam  Constitutional: He appears well-developed.  HENT:  Head: Atraumatic.  Eyes: EOM are normal.  Cardiovascular:  Irregular tachycardia  Pulmonary/Chest: Effort normal.  Abdominal: There is no tenderness.  Musculoskeletal: He exhibits tenderness.  Tenderness over left great toe.  Neurological: He is alert.  Skin: Skin is warm. Capillary refill takes less than 2 seconds.     ED Treatments / Results  Labs (all labs ordered are listed, but only abnormal results are displayed) Labs Reviewed  TROPONIN I - Abnormal; Notable for the following components:      Result Value   Troponin I 0.04 (*)    All other components within normal limits  CBC WITH DIFFERENTIAL/PLATELET - Abnormal; Notable for the following components:   Hemoglobin 17.3 (*)    All other components within normal limits  COMPREHENSIVE METABOLIC PANEL - Abnormal; Notable for the following components:   Glucose, Bld 156 (*)    BUN 30 (*)    Creatinine, Ser 1.72 (*)    Total Protein 8.2 (*)    GFR calc non Af Amer 41 (*)    GFR calc Af Amer 47 (*)    All other components within normal limits  RAPID URINE DRUG SCREEN, HOSP PERFORMED  COMPREHENSIVE METABOLIC PANEL    EKG EKG  Interpretation  Date/Time:  Sunday November 22 2017 13:39:09 EDT Ventricular Rate:  113 PR Interval:    QRS Duration: 134 QT Interval:  340 QTC Calculation: 467 R Axis:   -97 Text Interpretation:  Atrial fibrillation/flutter Paired ventricular premature complexes Nonspecific IVCD with LAD Confirmed by Davonna Belling [phone]) on 11/22/2017 1:51:38 PM   Radiology Dg Chest 2 View  Result Date: 11/22/2017 CLINICAL DATA:  Tachycardia, atrial fibrillation. EXAM: CHEST - 2 VIEW COMPARISON:  None. FINDINGS: The mediastinal contour is normal. Heart size is enlarged. Both lungs are clear. The visualized skeletal structures are unremarkable. IMPRESSION: No active cardiopulmonary disease. Cardiomegaly. Electronically Signed   By: Abelardo Diesel M.D.   On: 11/22/2017 14:07  Procedures Procedures (including critical care time)  Medications Ordered in ED Medications  diltiazem (CARDIZEM) 1 mg/mL load via infusion 10 mg (10 mg Intravenous Bolus from Bag 11/22/17 1437)    And  diltiazem (CARDIZEM) 100 mg in dextrose 5% 165m (1 mg/mL) infusion (5 mg/hr Intravenous Rate/Dose Change 11/22/17 1442)     Initial Impression / Assessment and Plan / ED Course  I have reviewed the triage vital signs and the nursing notes.  Pertinent labs & imaging results that were available during my care of the patient were reviewed by me and considered in my medical decision making (see chart for details).     Patient with current atrial flutter with RVR.  Recent admission for same.  Required IV Cardizem to help keep the rate controlled.  [place] VASC of 1 for hypertension.  Troponin mildly elevated but at apparent baseline.  Will admit to hospitalist.  CRITICAL CARE Performed by: NDavonna BellingTotal critical care time: 30 minutes Critical care time was exclusive of separately billable procedures and treating other patients. Critical care was necessary to treat or prevent imminent or life-threatening  deterioration. Critical care was time spent personally by me on the following activities: development of treatment plan with patient and/or surrogate as well as nursing, discussions with consultants, evaluation of patient's response to treatment, examination of patient, obtaining history from patient or surrogate, ordering and performing treatments and interventions, ordering and review of laboratory studies, ordering and review of radiographic studies, pulse oximetry and re-evaluation of patient's condition.  Discussed with Dr. LDarrick Meigsabout admission.   Final Clinical Impressions(s) / ED Diagnoses   Final diagnoses:  Atrial flutter with rapid ventricular response ([facility]    ED Discharge Orders    None       PDavonna Belling MD 11/22/17 1538    PDavonna Belling MD 11/22/17 [phone]

## 2017-11-22 NOTE — ED Notes (Signed)
Will call back in to give report. Floor not ready.

## 2017-11-22 NOTE — ED Notes (Signed)
ED Provider at bedside. 

## 2017-11-22 NOTE — H&P (Addendum)
TRH H&P   Patient Demographics:    Xavier Ford, is a 62 y.o. male  MRN: [id number]   DOB - [birth year]  Admit Date - 11/22/2017  Outpatient Primary MD for the patient is Apolonio Schneiders, MD  Referring MD/NP/PA:   Davonna Belling  Outpatient Specialists:     Patient coming from:  [facility]  Chief Complaint  Patient presents with  . Hypertension      HPI:    Xavier Ford  is a 62 y.o. male, w Hypertension, Pafib, CKD stage3, Gout, Schizoaffective do apparently presents with c/o chest pain just slight in the middle of the chest along with high bp and slight dyspnea earlier this morning and that is what prompted him to go to [facility].   In ED EKG afib/ flutter at 113, LAD, poor R progression  CXR IMPRESSION: No active cardiopulmonary disease.  Cardiomegaly.  Trop 0.04 Wbc 9.0, Hgb 17.3, Plt 301  Na 137, K 4.2, Bun 30, Creatinne 1.72 Ast 29, Alt 23  UDS Negative  Pt will be admitted for Afib/ flutter with RVR, and troponin elevation, and CKD stage3.    Review of systems:    In addition to the HPI above,  No Fever-chills, No Headache, No changes with Vision or hearing, No problems swallowing food or Liquids, No Cough  No Abdominal pain, No Nausea or Vommitting, Bowel movements are regular, No Blood in stool or Urine, No dysuria, No new skin rashes or bruises, No new joints pains-aches,  No new weakness, tingling, numbness in any extremity, No recent weight gain or loss, No polyuria, polydypsia or polyphagia, No significant Mental Stressors.  A full 10 point Review of Systems was done, except as stated above, all other Review of Systems were negative.   With Past History of the following :    Past Medical History:  Diagnosis Date  . Atrial fibrillation ([place])   . CKD (chronic kidney disease), stage III ([place])   . Gout   . Hypertension     . Schizoaffective disorder [facility])       Past Surgical History:  Procedure Laterality Date  . Left finger pinning    . LEFT HEART CATH AND CORONARY ANGIOGRAPHY N/A 06/27/2016   Procedure: Left Heart Cath and Coronary Angiography;  Surgeon: Nelva Bush, MD;  Location: [place];  Service: Cardiovascular;  Laterality: N/A;  . ORIF left radius        Social History:     Social History   Tobacco Use  . Smoking status: Never Smoker  . Smokeless tobacco: Never Used  Substance Use Topics  . Alcohol use: No     Lives - at [facility] - walks by self   Family History :     Family History  Problem Relation Age of Onset  . Hyperlipidemia Maternal Grandmother  Home Medications:   Prior to Admission medications   Medication Sig Start Date End Date Taking? Authorizing Provider  acetaminophen (TYLENOL) 500 MG tablet Take 2 tablets (1,000 mg total) by mouth every 8 (eight) hours as needed for mild pain. Max dose daily of tylenol is 4 grams 11/19/17   Eulogio Bear U, DO  allopurinol (ZYLOPRIM) 300 MG tablet Take 300 mg by mouth daily.    [provider]  apixaban (ELIQUIS) 5 MG TABS tablet Take 1 tablet (5 mg total) by mouth 2 (two) times daily. 06/30/16   Robbie Lis, MD  blood glucose meter kit and supplies KIT Dispense based on patient and insurance preference. Use up to four times daily as directed. (FOR ICD-9 250.00, 250.01). 11/19/17   Geradine Girt, DO  chlorthalidone (HYGROTON) 25 MG tablet Take 25 mg by mouth daily.    [provider]  diltiazem (CARDIZEM CD) 360 MG 24 hr capsule Take 360 mg by mouth daily.    [provider]  doxepin (SINEQUAN) 25 MG capsule Take 25 mg by mouth daily.    [provider]  isosorbide mononitrate (IMDUR) 30 MG 24 hr tablet Take 30 mg by mouth daily.    [provider]  pantoprazole (PROTONIX) 40 MG tablet Take 1 tablet (40 mg total) by mouth daily. While on steroids 11/20/17    Geradine Girt, DO  polyethylene glycol (MIRALAX / GLYCOLAX) packet Take 17 g by mouth daily.    [provider]  predniSONE (DELTASONE) 10 MG tablet Take 10 mg by mouth daily.    [provider]  rosuvastatin (CRESTOR) 10 MG tablet Take 10 mg by mouth every evening.    [provider]  senna-docusate (SENOKOT-S) 8.6-50 MG tablet Take 1 tablet by mouth at bedtime as needed for mild constipation. 11/19/17   Geradine Girt, DO     Allergies:     Allergies  Allergen Reactions  . Morphine Itching  . Tolmetin Hives and Swelling  . Beta Adrenergic Blockers Other (See Comments)    Cocaine Abuse  . Ibuprofen Hives  . Naproxen Sodium Hives  . Nsaids Hives  . Colchicine Rash    Patient states that he was told to stay off of this med due to "kidney problems"  . Gabapentin     Other reaction(s): Cough (ALLERGY/intolerance), GI Upset (intolerance) Patient states after taking the medication, his throat started swelling, he had a horrible headache and abd cramping.  . Morphine And Related Itching     Physical Exam:   Vitals  Blood pressure (!) 189/82, pulse 93, temperature (!) 97.5 F (36.4 C), resp. rate (!) 25, height _0  (1.803 m), weight 118.1 kg, SpO2 98 %.   1. General  lying in bed in NAD,    2. Normal affect and insight, Not Suicidal or Homicidal, Awake Alert, Oriented X 3.  3. No F.N deficits, ALL C.Nerves Intact, Strength 5/5 all 4 extremities, Sensation intact all 4 extremities, Plantars down going.  4. Ears and Eyes appear Normal, Conjunctivae clear, PERRLA. Moist Oral Mucosa.  5. Supple Neck, No JVD, No cervical lymphadenopathy appriciated, No Carotid Bruits.  6. Symmetrical Chest wall movement, Good air movement bilaterally, CTAB.  7. Irr, irr, s1, s2   8. Positive Bowel Sounds, Abdomen Soft, No tenderness, No organomegaly appriciated,No rebound -guarding or rigidity.  9.  No Cyanosis, Normal Skin Turgor, No Skin Rash or Bruise.  10.  Good muscle tone,  joints appear normal , no  effusions, Normal ROM.  11. No Palpable Lymph Nodes in Neck or Axillae      Data Review:    CBC Recent Labs  Lab 11/18/17 0922 11/19/17 0511 11/22/17 1419  WBC 9.4 8.1 9.0  HGB 16.6 15.9 17.3*  HCT 47.8 48.2 50.6  PLT 295 301 301  MCV 87.9 91.3 89.4  MCH 30.5 30.1 30.6  MCHC 34.7 33.0 34.2  RDW 13.7 13.8 14.2  LYMPHSABS 2.4  --  1.7  MONOABS 0.8  --  0.7  EOSABS 0.1  --  0.1  BASOSABS 0.0  --  0.1   ------------------------------------------------------------------------------------------------------------------  Chemistries  Recent Labs  Lab 11/18/17 0922 11/19/17 0511 11/22/17 1450  NA 137 138 137  K 3.7 4.4 4.2  CL 100 95* 100  CO2 _0 GLUCOSE 114* 99 156*  BUN 30* 31* 30*  CREATININE 1.88* 1.95* 1.72*  CALCIUM 9.1 9.3 9.3  MG 2.4  --   --   AST  --   --  29  ALT  --   --  23  ALKPHOS  --   --  76  BILITOT  --   --  0.5   ------------------------------------------------------------------------------------------------------------------ estimated creatinine clearance is 58.2 mL/min (A) (by C-G formula based on SCr of 1.72 mg/dL (H)). ------------------------------------------------------------------------------------------------------------------ No results for input(s): TSH, T4TOTAL, T3FREE, THYROIDAB in the last 72 hours.  Invalid input(s): FREET3  Coagulation profile No results for input(s): INR, PROTIME in the last 168 hours. ------------------------------------------------------------------------------------------------------------------- No results for input(s): DDIMER in the last 72 hours. -------------------------------------------------------------------------------------------------------------------  Cardiac Enzymes Recent Labs  Lab 11/18/17 1815 11/19/17 0511 11/22/17 1419  TROPONINI 0.03* 0.03* 0.04*    ------------------------------------------------------------------------------------------------------------------ No results found for: BNP   ---------------------------------------------------------------------------------------------------------------  Urinalysis No results found for: COLORURINE, APPEARANCEUR, LABSPEC, PHURINE, GLUCOSEU, HGBUR, BILIRUBINUR, KETONESUR, PROTEINUR, UROBILINOGEN, NITRITE, LEUKOCYTESUR  ----------------------------------------------------------------------------------------------------------------   Imaging Results:    Dg Chest 2 View  Result Date: 11/22/2017 CLINICAL DATA:  Tachycardia, atrial fibrillation. EXAM: CHEST - 2 VIEW COMPARISON:  None. FINDINGS: The mediastinal contour is normal. Heart size is enlarged. Both lungs are clear. The visualized skeletal structures are unremarkable. IMPRESSION: No active cardiopulmonary disease. Cardiomegaly. Electronically Signed   By: Abelardo Diesel M.D.   On: 11/22/2017 14:07       Assessment & Plan:    Principal Problem:   Atrial flutter (HCC) Active Problems:   CKD (chronic kidney disease) stage 3, GFR 30-59 ml/min (HCC)   Gout   Essential hypertension   Elevated troponin    Afib/ Flutter with RVR Tele Trop iq6h x3 cardizem GTT Check cardiac echo Eliquis pharmacy to dose  Troponin elevation Check cardiac echo Cardiology consult by email  Hypertension uncontrolled Hydralazine 76m iv q6h prn sbp >160 Continue chlorthalidone 269mpo qday Continue Imdur Check cbc, cmp, in am  Hyperlipidemia Cont Crestor 1074mo qhs  Gerd Cont PPI  Gout Cont Allopurinol 300m12m qday Cont prednisone 10mg14mqday  DVT Prophylaxis Eliquis- SCDs   AM Labs Ordered, also please review Full Orders  Family Communication: Admission, patients condition and plan of care including tests being ordered have been discussed with the patient  who indicate understanding and agree with the plan and Code  Status.  Code Status  FULL CODE  Likely DC to  home  Condition GUARDED    Consults called:  Cardiology by email  Admission status:   observation  Time spent in minutes : 60 min critical care   JamesJani Gravelon 11/22/2017  at 9:15 PM  Between 7am to 7pm - Pager - [phone]  . After 7pm go to [email or web address] - password [email or web address]  [facility] - Office  [phone]

## 2017-11-22 NOTE — ED Notes (Signed)
Date and time results received: 11/22/17 1505   Test: trp Critical Value: 0.04 Name of Provider Notified: Rubin PayorPickering Orders Received? Or Actions Taken?: no orders given

## 2017-11-22 NOTE — ED Triage Notes (Addendum)
Patient states that he has been at [facility] - he started to sweat earlier today and he have similar symptoms when he was here recently. They took his BP and it was high  - patient states that he has pain to his right foot which is his biggest complaint and reports " Its my gout"

## 2017-11-23 ENCOUNTER — Ambulatory Visit (HOSPITAL_COMMUNITY): Payer: Medicaid Other

## 2017-11-23 DIAGNOSIS — I480 Paroxysmal atrial fibrillation: Secondary | ICD-10-CM | POA: Diagnosis not present

## 2017-11-23 DIAGNOSIS — Z7901 Long term (current) use of anticoagulants: Secondary | ICD-10-CM | POA: Diagnosis not present

## 2017-11-23 DIAGNOSIS — Z7952 Long term (current) use of systemic steroids: Secondary | ICD-10-CM | POA: Diagnosis not present

## 2017-11-23 DIAGNOSIS — Z79899 Other long term (current) drug therapy: Secondary | ICD-10-CM | POA: Diagnosis not present

## 2017-11-23 DIAGNOSIS — E785 Hyperlipidemia, unspecified: Secondary | ICD-10-CM | POA: Diagnosis present

## 2017-11-23 DIAGNOSIS — E669 Obesity, unspecified: Secondary | ICD-10-CM | POA: Diagnosis present

## 2017-11-23 DIAGNOSIS — Z8349 Family history of other endocrine, nutritional and metabolic diseases: Secondary | ICD-10-CM | POA: Diagnosis not present

## 2017-11-23 DIAGNOSIS — F1410 Cocaine abuse, uncomplicated: Secondary | ICD-10-CM | POA: Diagnosis present

## 2017-11-23 DIAGNOSIS — I471 Supraventricular tachycardia: Secondary | ICD-10-CM

## 2017-11-23 DIAGNOSIS — F259 Schizoaffective disorder, unspecified: Secondary | ICD-10-CM | POA: Diagnosis present

## 2017-11-23 DIAGNOSIS — K219 Gastro-esophageal reflux disease without esophagitis: Secondary | ICD-10-CM | POA: Diagnosis present

## 2017-11-23 DIAGNOSIS — G4733 Obstructive sleep apnea (adult) (pediatric): Secondary | ICD-10-CM | POA: Diagnosis present

## 2017-11-23 DIAGNOSIS — Z885 Allergy status to narcotic agent status: Secondary | ICD-10-CM | POA: Diagnosis not present

## 2017-11-23 DIAGNOSIS — M109 Gout, unspecified: Secondary | ICD-10-CM | POA: Diagnosis present

## 2017-11-23 DIAGNOSIS — I10 Essential (primary) hypertension: Secondary | ICD-10-CM | POA: Diagnosis not present

## 2017-11-23 DIAGNOSIS — I4892 Unspecified atrial flutter: Secondary | ICD-10-CM | POA: Diagnosis not present

## 2017-11-23 DIAGNOSIS — Z7151 Drug abuse counseling and surveillance of drug abuser: Secondary | ICD-10-CM | POA: Diagnosis not present

## 2017-11-23 DIAGNOSIS — R748 Abnormal levels of other serum enzymes: Secondary | ICD-10-CM | POA: Diagnosis not present

## 2017-11-23 DIAGNOSIS — I129 Hypertensive chronic kidney disease with stage 1 through stage 4 chronic kidney disease, or unspecified chronic kidney disease: Secondary | ICD-10-CM | POA: Diagnosis present

## 2017-11-23 DIAGNOSIS — E1122 Type 2 diabetes mellitus with diabetic chronic kidney disease: Secondary | ICD-10-CM | POA: Diagnosis present

## 2017-11-23 DIAGNOSIS — I483 Typical atrial flutter: Secondary | ICD-10-CM | POA: Diagnosis present

## 2017-11-23 DIAGNOSIS — I2510 Atherosclerotic heart disease of native coronary artery without angina pectoris: Secondary | ICD-10-CM | POA: Diagnosis present

## 2017-11-23 DIAGNOSIS — R7989 Other specified abnormal findings of blood chemistry: Secondary | ICD-10-CM | POA: Diagnosis present

## 2017-11-23 DIAGNOSIS — N183 Chronic kidney disease, stage 3 (moderate): Secondary | ICD-10-CM

## 2017-11-23 DIAGNOSIS — Z886 Allergy status to analgesic agent status: Secondary | ICD-10-CM | POA: Diagnosis not present

## 2017-11-23 DIAGNOSIS — Z888 Allergy status to other drugs, medicaments and biological substances status: Secondary | ICD-10-CM | POA: Diagnosis not present

## 2017-11-23 DIAGNOSIS — Z6836 Body mass index (BMI) 36.0-36.9, adult: Secondary | ICD-10-CM | POA: Diagnosis not present

## 2017-11-23 DIAGNOSIS — M1A3210 Chronic gout due to renal impairment, right elbow, without tophus (tophi): Secondary | ICD-10-CM | POA: Diagnosis not present

## 2017-11-23 DIAGNOSIS — I5030 Unspecified diastolic (congestive) heart failure: Secondary | ICD-10-CM | POA: Diagnosis not present

## 2017-11-23 LAB — COMPREHENSIVE METABOLIC PANEL
ALBUMIN: 3.8 g/dL (ref 3.5–5.0)
ALT: 22 U/L (ref 0–44)
ANION GAP: 12 (ref 5–15)
AST: 24 U/L (ref 15–41)
Alkaline Phosphatase: 66 U/L (ref 38–126)
BUN: 26 mg/dL — ABNORMAL HIGH (ref 8–23)
CALCIUM: 9.3 mg/dL (ref 8.9–10.3)
CO2: 27 mmol/L (ref 22–32)
CREATININE: 1.83 mg/dL — AB (ref 0.61–1.24)
Chloride: 100 mmol/L (ref 98–111)
GFR calc Af Amer: 44 mL/min — ABNORMAL LOW (ref >60)
GFR calc non Af Amer: 38 mL/min — ABNORMAL LOW (ref >60)
GLUCOSE: 95 mg/dL (ref 70–99)
Potassium: 4 mmol/L (ref 3.5–5.1)
SODIUM: 139 mmol/L (ref 135–145)
Total Bilirubin: 0.7 mg/dL (ref 0.3–1.2)
Total Protein: 7 g/dL (ref 6.5–8.1)

## 2017-11-23 LAB — CBC
HCT: 46.2 % (ref 39.0–52.0)
HEMOGLOBIN: 15.2 g/dL (ref 13.0–17.0)
MCH: 30.2 pg (ref 26.0–34.0)
MCHC: 32.9 g/dL (ref 30.0–36.0)
MCV: 91.8 fL (ref 78.0–100.0)
Platelets: 290 10*3/uL (ref 150–400)
RBC: 5.03 MIL/uL (ref 4.22–5.81)
RDW: 13.9 % (ref 11.5–15.5)
WBC: 8.4 10*3/uL (ref 4.0–10.5)

## 2017-11-23 LAB — TROPONIN I
Troponin I: 0.04 ng/mL (ref <0.03)
Troponin I: 0.03 ng/mL (ref <0.03)

## 2017-11-23 LAB — GLUCOSE, CAPILLARY
GLUCOSE-CAPILLARY: 195 mg/dL — AB (ref 70–99)
GLUCOSE-CAPILLARY: 229 mg/dL — AB (ref 70–99)
Glucose-Capillary: 149 mg/dL — ABNORMAL HIGH (ref 70–99)

## 2017-11-23 MED ORDER — CARVEDILOL 6.25 MG PO TABS
6.2500 mg | ORAL_TABLET | Freq: Two times a day (BID) | ORAL | Status: DC
  Administered 2017-11-23 – 2017-11-24 (×2): 6.25 mg via ORAL
  Filled 2017-11-23: qty 1

## 2017-11-23 MED ORDER — DOCUSATE SODIUM 100 MG PO CAPS
100.0000 mg | ORAL_CAPSULE | Freq: Two times a day (BID) | ORAL | Status: DC | PRN
  Administered 2017-11-23: 100 mg via ORAL
  Filled 2017-11-23: qty 1

## 2017-11-23 MED ORDER — FLECAINIDE ACETATE 50 MG PO TABS
50.0000 mg | ORAL_TABLET | Freq: Two times a day (BID) | ORAL | Status: DC
  Administered 2017-11-23 – 2017-11-24 (×2): 50 mg via ORAL
  Filled 2017-11-23 (×3): qty 1

## 2017-11-23 MED ORDER — ZOLPIDEM TARTRATE 5 MG PO TABS
10.0000 mg | ORAL_TABLET | Freq: Every day | ORAL | Status: DC
Start: 2017-11-23 — End: 2017-11-24
  Administered 2017-11-23: 10 mg via ORAL
  Filled 2017-11-23: qty 2

## 2017-11-23 MED ORDER — OXYCODONE HCL 5 MG PO TABS
5.0000 mg | ORAL_TABLET | Freq: Once | ORAL | Status: AC
  Administered 2017-11-23: 5 mg via ORAL
  Filled 2017-11-23: qty 1

## 2017-11-23 NOTE — Discharge Instructions (Addendum)
Information on my medicine - ELIQUIS (apixaban)  This medication education was reviewed with me or my healthcare representative as part of my discharge preparation.  The pharmacist that spoke with me during my hospital stay was:  Mosetta AnisMichael T Bitonti, [facility]  Why was Eliquis prescribed for you? Eliquis was prescribed for you to reduce the risk of a blood clot forming that can cause a stroke if you have a medical condition called atrial fibrillation (a type of irregular heartbeat).  What do You need to know about Eliquis ? Take your Eliquis TWICE DAILY - one tablet in the morning and one tablet in the evening with or without food. If you have difficulty swallowing the tablet whole please discuss with your pharmacist how to take the medication safely.   Blood Glucose Monitoring, Adult Monitoring your blood sugar (glucose) helps you manage your diabetes. It also helps you and your health care provider determine how well your diabetes management plan is working. Blood glucose monitoring involves checking your blood glucose as often as directed, and keeping a record (log) of your results over time. Why should I monitor my blood glucose? Checking your blood glucose regularly can:  Help you understand how food, exercise, illnesses, and medicines affect your blood glucose.  Let you know what your blood glucose is at any time. You can quickly tell if you are having low blood glucose (hypoglycemia) or high blood glucose (hyperglycemia).  Help you and your health care provider adjust your medicines as needed.  When should I check my blood glucose? Follow instructions from your health care provider about how often to check your blood glucose. This may depend on:  The type of diabetes you have.  How well-controlled your diabetes is.  Medicines you are taking.  If you have type 1 diabetes:  Check your blood glucose at least 2 times a day.  Also check your blood glucose: ? Before every insulin  injection. ? Before and after exercise. ? Between meals. ? 2 hours after a meal. ? Occasionally between 2:00 a.m. and 3:00 a.m., as directed. ? Before potentially dangerous tasks, like driving or using heavy machinery. ? At bedtime.  You may need to check your blood glucose more often, up to 6-10 times a day: ? If you use an insulin pump. ? If you need multiple daily injections (MDI). ? If your diabetes is not well-controlled. ? If you are ill. ? If you have a history of severe hypoglycemia. ? If you have a history of not knowing when your blood glucose is getting low (hypoglycemia unawareness). If you have type 2 diabetes:  If you take insulin or other diabetes medicines, check your blood glucose at least 2 times a day.  If you are on intensive insulin therapy, check your blood glucose at least 4 times a day. Occasionally, you may also need to check between 2:00 a.m. and 3:00 a.m., as directed.  Also check your blood glucose: ? Before and after exercise. ? Before potentially dangerous tasks, like driving or using heavy machinery.  You may need to check your blood glucose more often if: ? Your medicine is being adjusted. ? Your diabetes is not well-controlled. ? You are ill. What is a blood glucose log?  A blood glucose log is a record of your blood glucose readings. It helps you and your health care provider: ? Look for patterns in your blood glucose over time. ? Adjust your diabetes management plan as needed.  Every time you check your  blood glucose, write down your result and notes about things that may be affecting your blood glucose, such as your diet and exercise for the day.  Most glucose meters store a record of glucose readings in the meter. Some meters allow you to download your records to a computer. How do I check my blood glucose? Follow these steps to get accurate readings of your blood glucose: Supplies needed   Blood glucose meter.  Test strips for your  meter. Each meter has its own strips. You must use the strips that come with your meter.  A needle to prick your finger (lancet). Do not use lancets more than once.  A device that holds the lancet (lancing device).  A journal or log book to write down your results. Procedure  Wash your hands with soap and water.  Prick the side of your finger (not the tip) with the lancet. Use a different finger each time.  Gently rub the finger until a small drop of blood appears.  Follow instructions that come with your meter for inserting the test strip, applying blood to the strip, and using your blood glucose meter.  Write down your result and any notes. Alternative testing sites  Some meters allow you to use areas of your body other than your finger (alternative sites) to test your blood.  If you think you may have hypoglycemia, or if you have hypoglycemia unawareness, do not use alternative sites. Use your finger instead.  Alternative sites may not be as accurate as the fingers, because blood flow is slower in these areas. This means that the result you get may be delayed, and it may be different from the result that you would get from your finger.  The most common alternative sites are: ? Forearm. ? Thigh. ? Palm of the hand. Additional tips  Always keep your supplies with you.  If you have questions or need help, all blood glucose meters have a 24-hour hotline number that you can call. You may also contact your health care provider.  After you use a few boxes of test strips, adjust (calibrate) your blood glucose meter by following instructions that came with your meter. This information is not intended to replace advice given to you by your health care provider. Make sure you discuss any questions you have with your health care provider. Document Released: 04/03/2003 Document Revised: 10/19/2015 Document Reviewed: 09/10/2015 Elsevier Interactive Patient Education  2017 Elsevier  Inc[employer]   Take Eliquis exactly as prescribed by your doctor and DO NOT stop taking Eliquis without talking to the doctor who prescribed the medication.  Stopping may increase your risk of developing a stroke.  Refill your prescription before you run out.  After discharge, you should have regular check-up appointments with your healthcare provider that is prescribing your Eliquis.  In the future your dose may need to be changed if your kidney function or weight changes by a significant amount or as you get older.  What do you do if you miss a dose? If you miss a dose, take it as soon as you remember on the same day and resume taking twice daily.  Do not take more than one dose of ELIQUIS at the same time to make up a missed dose.  Important Safety Information A possible side effect of Eliquis is bleeding. You should call your healthcare provider right away if you experience any of the following: ? Bleeding from an injury or your nose that does not stop. ?  Unusual colored urine (red or dark brown) or unusual colored stools (red or black). ? Unusual bruising for unknown reasons. ? A serious fall or if you hit your head (even if there is no bleeding).  Some medicines may interact with Eliquis and might increase your risk of bleeding or clotting while on Eliquis. To help avoid this, consult your healthcare provider or pharmacist prior to using any new prescription or non-prescription medications, including herbals, vitamins, non-steroidal anti-inflammatory drugs (NSAIDs) and supplements.  This website has more information on Eliquis (apixaban): http://www.eliquis.com/eliquis/home  Flecainide tablets What is this medicine? FLECAINIDE (FLEK a nide) is an antiarrhythmic drug. This medicine is used to prevent irregular heart rhythm. It can also slow down fast heartbeats called tachycardia. This medicine may be used for other purposes; ask your health care provider or pharmacist if you have  questions. COMMON BRAND NAME(S): Tambocor What should I tell my health care provider before I take this medicine? They need to know if you have any of these conditions: -abnormal levels of potassium in the blood -heart disease including heart rhythm and heart rate problems -kidney or liver disease -recent heart attack -an unusual or allergic reaction to flecainide, local anesthetics, other medicines, foods, dyes, or preservatives -pregnant or trying to get pregnant -breast-feeding How should I use this medicine? Take this medicine by mouth with a glass of water. Follow the directions on the prescription label. You can take this medicine with or without food. Take your doses at regular intervals. Do not take your medicine more often than directed. Do not stop taking this medicine suddenly. This may cause serious, heart-related side effects. If your doctor wants you to stop the medicine, the dose may be slowly lowered over time to avoid any side effects. Talk to your pediatrician regarding the use of this medicine in children. While this drug may be prescribed for children as young as 1 year of age for selected conditions, precautions do apply. Overdosage: If you think you have taken too much of this medicine contact a poison control center or emergency room at once. NOTE: This medicine is only for you. Do not share this medicine with others. What if I miss a dose? If you miss a dose, take it as soon as you can. If it is almost time for your next dose, take only that dose. Do not take double or extra doses. What may interact with this medicine? Do not take this medicine with any of the following medications: -amoxapine -arsenic trioxide -certain antibiotics like clarithromycin, erythromycin, gatifloxacin, gemifloxacin, levofloxacin, moxifloxacin, sparfloxacin, or troleandomycin -certain antidepressants called tricyclic antidepressants like amitriptyline, imipramine, or nortriptyline -certain  medicines to control heart rhythm like disopyramide, dofetilide, encainide, moricizine, procainamide, propafenone, and quinidine -cisapride -cyclobenzaprine -delavirdine -droperidol -haloperidol -hawthorn -imatinib -levomethadyl -maprotiline -medicines for malaria like chloroquine and halofantrine -pentamidine -phenothiazines like chlorpromazine, mesoridazine, prochlorperazine, thioridazine -pimozide -quinine -ranolazine -ritonavir -sertindole -ziprasidone This medicine may also interact with the following medications: -cimetidine -medicines for angina or high blood pressure -medicines to control heart rhythm like amiodarone and digoxin This list may not describe all possible interactions. Give your health care provider a list of all the medicines, herbs, non-prescription drugs, or dietary supplements you use. Also tell them if you smoke, drink alcohol, or use illegal drugs. Some items may interact with your medicine. What should I watch for while using this medicine? Visit your doctor or health care professional for regular checks on your progress. Because your condition and the use of this medicine carries  some risk, it is a good idea to carry an identification card, necklace or bracelet with details of your condition, medications and doctor or health care professional. Check your blood pressure and pulse rate regularly. Ask your health care professional what your blood pressure and pulse rate should be, and when you should contact him or her. Your doctor or health care professional also may schedule regular blood tests and electrocardiograms to check your progress. You may get drowsy or dizzy. Do not drive, use machinery, or do anything that needs mental alertness until you know how this medicine affects you. Do not stand or sit up quickly, especially if you are an older patient. This reduces the risk of dizzy or fainting spells. Alcohol can make you more dizzy, increase flushing and  rapid heartbeats. Avoid alcoholic drinks. What side effects may I notice from receiving this medicine? Side effects that you should report to your doctor or health care professional as soon as possible: -chest pain, continued irregular heartbeats -difficulty breathing -swelling of the legs or feet -trembling, shaking -unusually weak or tired Side effects that usually do not require medical attention (report to your doctor or health care professional if they continue or are bothersome): -blurred vision -constipation -headache -nausea, vomiting -stomach pain This list may not describe all possible side effects. Call your doctor for medical advice about side effects. You may report side effects to FDA at 1-800-FDA-1088. Where should I keep my medicine? Keep out of the reach of children. Store at room temperature between 15 and 30 degrees C (59 and 86 degrees F). Protect from light. Keep container tightly closed. Throw away any unused medicine after the expiration date. NOTE: This sheet is a summary. It may not cover all possible information. If you have questions about this medicine, talk to your doctor, pharmacist, or health care provider.  2018 Elsevier/Gold Standard (2007-08-04 16:46:09)   Diabetes Mellitus and Nutrition When you have diabetes (diabetes mellitus), it is very important to have healthy eating habits because your blood sugar (glucose) levels are greatly affected by what you eat and drink. Eating healthy foods in the appropriate amounts, at about the same times every day, can help you:  Control your blood glucose.  Lower your risk of heart disease.  Improve your blood pressure.  Reach or maintain a healthy weight.  Every person with diabetes is different, and each person has different needs for a meal plan. Your health care provider may recommend that you work with a diet and nutrition specialist (dietitian) to make a meal plan that is best for you. Your meal plan may  vary depending on factors such as:  The calories you need.  The medicines you take.  Your weight.  Your blood glucose, blood pressure, and cholesterol levels.  Your activity level.  Other health conditions you have, such as heart or kidney disease.  How do carbohydrates affect me? Carbohydrates affect your blood glucose level more than any other type of food. Eating carbohydrates naturally increases the amount of glucose in your blood. Carbohydrate counting is a method for keeping track of how many carbohydrates you eat. Counting carbohydrates is important to keep your blood glucose at a healthy level, especially if you use insulin or take certain oral diabetes medicines. It is important to know how many carbohydrates you can safely have in each meal. This is different for every person. Your dietitian can help you calculate how many carbohydrates you should have at each meal and for snack. Foods that  contain carbohydrates include:  Bread, cereal, rice, pasta, and crackers.  Potatoes and corn.  Peas, beans, and lentils.  Milk and yogurt.  Fruit and juice.  Desserts, such as cakes, cookies, ice cream, and candy.  How does alcohol affect me? Alcohol can cause a sudden decrease in blood glucose (hypoglycemia), especially if you use insulin or take certain oral diabetes medicines. Hypoglycemia can be a life-threatening condition. Symptoms of hypoglycemia (sleepiness, dizziness, and confusion) are similar to symptoms of having too much alcohol. If your health care provider says that alcohol is safe for you, follow these guidelines:  Limit alcohol intake to no more than 1 drink per day for nonpregnant women and 2 drinks per day for men. One drink equals 12 oz of beer, 5 oz of wine, or 1 oz of hard liquor.  Do not drink on an empty stomach.  Keep yourself hydrated with water, diet soda, or unsweetened iced tea.  Keep in mind that regular soda, juice, and other mixers may contain a  lot of sugar and must be counted as carbohydrates.  What are tips for following this plan? Reading food labels  Start by checking the serving size on the label. The amount of calories, carbohydrates, fats, and other nutrients listed on the label are based on one serving of the food. Many foods contain more than one serving per package.  Check the total grams (g) of carbohydrates in one serving. You can calculate the number of servings of carbohydrates in one serving by dividing the total carbohydrates by 15. For example, if a food has 30 g of total carbohydrates, it would be equal to 2 servings of carbohydrates.  Check the number of grams (g) of saturated and trans fats in one serving. Choose foods that have low or no amount of these fats.  Check the number of milligrams (mg) of sodium in one serving. Most people should limit total sodium intake to less than 2,300 mg per day.  Always check the nutrition information of foods labeled as "low-fat" or "nonfat". These foods may be higher in added sugar or refined carbohydrates and should be avoided.  Talk to your dietitian to identify your daily goals for nutrients listed on the label. Shopping  Avoid buying canned, premade, or processed foods. These foods tend to be high in fat, sodium, and added sugar.  Shop around the outside edge of the grocery store. This includes fresh fruits and vegetables, bulk grains, fresh meats, and fresh dairy. Cooking  Use low-heat cooking methods, such as baking, instead of high-heat cooking methods like deep frying.  Cook using healthy oils, such as olive, canola, or sunflower oil.  Avoid cooking with butter, cream, or high-fat meats. Meal planning  Eat meals and snacks regularly, preferably at the same times every day. Avoid going long periods of time without eating.  Eat foods high in fiber, such as fresh fruits, vegetables, beans, and whole grains. Talk to your dietitian about how many servings of  carbohydrates you can eat at each meal.  Eat 4-6 ounces of lean protein each day, such as lean meat, chicken, fish, eggs, or tofu. 1 ounce is equal to 1 ounce of meat, chicken, or fish, 1 egg, or 1/4 cup of tofu.  Eat some foods each day that contain healthy fats, such as avocado, nuts, seeds, and fish. Lifestyle   Check your blood glucose regularly.  Exercise at least 30 minutes 5 or more days each week, or as told by your health care provider.  Take medicines as told by your health care provider.  Do not use any products that contain nicotine or tobacco, such as cigarettes and e-cigarettes. If you need help quitting, ask your health care provider.  Work with a Veterinary surgeon or diabetes educator to identify strategies to manage stress and any emotional and social challenges. What are some questions to ask my health care provider?  Do I need to meet with a diabetes educator?  Do I need to meet with a dietitian?  What number can I call if I have questions?  When are the best times to check my blood glucose? Where to find more information:  American Diabetes Association: diabetes.org/food-and-fitness/food  Academy of Nutrition and Dietetics: https://www.vargas.com/  [employer] of Diabetes and Digestive and Kidney Diseases (NIH): FindJewelers.cz Summary  A healthy meal plan will help you control your blood glucose and maintain a healthy lifestyle.  Working with a diet and nutrition specialist (dietitian) can help you make a meal plan that is best for you.  Keep in mind that carbohydrates and alcohol have immediate effects on your blood glucose levels. It is important to count carbohydrates and to use alcohol carefully. This information is not intended to replace advice given to you by your health care provider. Make sure you discuss any questions you have with your  health care provider. Document Released: 12/26/2004 Document Revised: 05/05/2016 Document Reviewed: 05/05/2016 Elsevier Interactive Patient Education  [employer].

## 2017-11-23 NOTE — Progress Notes (Signed)
Lab called about trop 0.04. Md already aware of previous lab value the same.  Pt on Eliquis.  Will continue to monitor Karena AddisonCoro, Gisell Buehrle T

## 2017-11-23 NOTE — Progress Notes (Signed)
[facility] TEAM 1 - Stepdown/ICU TEAM  Xavier RenshawJoseph L Ford  KZS:[id number] DOB: [birth year] DOA: 11/22/2017 PCP: Xavier Ford, Xavier Ford    Brief Narrative:  62 y.o. male w/ a hx of HTN, Parox Afib, CKD stage 3, Gout, and Schizoaffective disorder who presented w/ chest pain in the middle of the chest along with high bp and slight dyspnea.   In ED his EKG noted afib/ flutter at 113, and a CXR noted no active cardiopulmonary disease.  Significant Events: 8/11 admit   Subjective: Resting comfortably in bed.  Denies any further cp at this time.  No sob, n/v, or abdom pain.    Assessment & Plan:  Chest pain Troponin elevation likely rate related in setting of RVR - pain resolved - Cards has evaluated - cont medical management   Recent Labs  Lab 11/19/17 0511 11/22/17 1419 11/22/17 2143 11/23/17 0301 11/23/17 0932  TROPONINI 0.03* 0.04* <0.03 0.04* 0.03*   Chronic Afib / Aflutter w/ acute RVR TSH normal - Mg is normal - still requiring IV CCB for rate control - Cards adding BB and flecainide   CKD Stage 3 Renal fxn appears to be stable - follow trend   Recent Labs  Lab 11/18/17 0922 11/19/17 0511 11/22/17 1450 11/23/17 0301  CREATININE 1.88* 1.95* 1.72* 1.83*    HTN BB being added to his tx regimen  Hx cocaine abuse last use 30 days ago - has been counseled on absolute need to fully abstain   DM A1c c/w DM - is not on meds at home - follow CBG w/ SSI for now   HLD  GERD  Gout  On chronic steroid tx - quiescent at this time  Hx OSA  needs formal outpatient testing - defer to PCP  Obesity - Body mass index is 36.17 kg/m.   DVT prophylaxis: Eliquis  Code Status: FULL CODE Family Communication: no family present at time of exam  Disposition Plan: SDU on cardizem gtt   Consultants:  [facility]   Antimicrobials:  none   Objective: Ford pressure 136/87, pulse 79, temperature 98.2 F (36.8 C), temperature source Oral, resp. rate (!) 23, height 5\' 11"   (1.803 m), weight 117.6 kg, SpO2 96 %.  Intake/Output Summary (Last 24 hours) at 11/23/2017 1413 Last data filed at 11/23/2017 1100 Gross per 24 hour  Intake 567.32 ml  Output 325 ml  Net 242.32 ml   Filed Weights   11/22/17 1215 11/22/17 2059 11/23/17 0528  Weight: 118 kg 118.1 kg 117.6 kg    Examination: General: No acute respiratory distress Lungs: Clear to auscultation bilaterally without wheezes or crackles Cardiovascular: Regular rate and rhythm at time of exam  Abdomen: Nontender, nondistended, soft, bowel sounds positive, no rebound, no ascites, no appreciable mass Extremities: No significant cyanosis, clubbing, or edema bilateral lower extremities  CBC: Recent Labs  Lab 11/18/17 0922 11/19/17 0511 11/22/17 1419 11/23/17 0301  WBC 9.4 8.1 9.0 8.4  NEUTROABS 6.1  --  6.5  --   HGB 16.6 15.9 17.3* 15.2  HCT 47.8 48.2 50.6 46.2  MCV 87.9 91.3 89.4 91.8  PLT 295 301 301 290   Basic Metabolic Panel: Recent Labs  Lab 11/18/17 0922 11/19/17 0511 11/22/17 1450 11/23/17 0301  NA 137 138 137 139  K 3.7 4.4 4.2 4.0  CL 100 95* 100 100  CO2 27 29 26 27   GLUCOSE 114* 99 156* 95  BUN 30* 31* 30* 26*  CREATININE 1.88* 1.95* 1.72* 1.83*  CALCIUM 9.1 9.3  9.3 9.3  MG 2.4  --   --   --    GFR: Estimated Creatinine Clearance: 54.6 mL/min (A) (by C-G formula based on SCr of 1.83 mg/dL (H)).  Liver Function Tests: Recent Labs  Lab 11/22/17 1450 11/23/17 0301  AST 29 24  ALT 23 22  ALKPHOS 76 66  BILITOT 0.5 0.7  PROT 8.2* 7.0  ALBUMIN 4.4 3.8    Cardiac Enzymes: Recent Labs  Lab 11/19/17 0511 11/22/17 1419 11/22/17 2143 11/23/17 0301 11/23/17 0932  TROPONINI 0.03* 0.04* <0.03 0.04* 0.03*    HbA1C: Hgb A1c MFr Bld  Date/Time Value Ref Range Status  11/18/2017 06:15 PM 6.5 (H) 4.8 - 5.6 % Final    Comment:    (NOTE) Pre diabetes:          5.7%-6.4% Diabetes:              >6.4% Glycemic control for   <7.0% adults with diabetes     CBG: Recent  Labs  Lab 11/18/17 2117 11/19/17 0758 11/19/17 1158 11/22/17 2142 11/23/17 1138  GLUCAP 199* 94 99 206* 195*    Recent Results (from the past 240 hour(s))  MRSA PCR Screening     Status: None   Collection Time: 11/18/17  4:00 PM  Result Value Ref Range Status   MRSA by PCR NEGATIVE NEGATIVE Final    Comment:        The GeneXpert MRSA Assay (FDA approved for NASAL specimens only), is one component of a comprehensive MRSA colonization surveillance program. It is not intended to diagnose MRSA infection nor to guide or monitor treatment for MRSA infections. Performed at [facility], [address] [place], [place] [address]      Scheduled Meds: . allopurinol  300 mg Oral Daily  . apixaban  5 mg Oral BID  . chlorthalidone  25 mg Oral Daily  . doxepin  25 mg Oral Daily  . insulin aspart  0-5 Units Subcutaneous QHS  . insulin aspart  0-9 Units Subcutaneous TID WC  . isosorbide mononitrate  30 mg Oral Daily  . nitroGLYCERIN  1 inch Topical Q8H  . pantoprazole  40 mg Oral Daily  . polyethylene glycol  17 g Oral Daily  . predniSONE  10 mg Oral Daily  . rosuvastatin  10 mg Oral QPM  . sodium chloride flush  3 mL Intravenous Q12H   Continuous Infusions: . sodium chloride 5 mL/hr at 11/23/17 1100  . diltiazem (CARDIZEM) infusion 5 mg/hr (11/23/17 1100)     LOS: 1 day   Xavier BloodJeffrey T. Kelissa Merlin, [facility] Office  [phone] Pager - Text Page per Amion  If 7PM-7AM, please contact night-coverage per Amion 11/23/2017, 2:13 PM

## 2017-11-23 NOTE — Progress Notes (Signed)
Notified Md about pt c/o pain and tramadol not helping had percocet other day and helped. Karena Addisonoro, Alverda Nazzaro T

## 2017-11-23 NOTE — Progress Notes (Signed)
Pt placed on CPAP for the night- tolerating well. 

## 2017-11-23 NOTE — Progress Notes (Signed)
Had stopped Cardizem gtt earlier due to pt NSR hr 40-70's. Pt HR Later continued to climb with  in /out of aflutter.  Hr high as 130-s restarted Cardizem gtt.  Will continue to monitor. Karena Addisonoro, Alvenia Treese T

## 2017-11-23 NOTE — Progress Notes (Signed)
Notified Md for pt request for cpap at hs.  Karena Addisonoro, Mae Denunzio T

## 2017-11-23 NOTE — Consult Note (Addendum)
Cardiology Consult    Patient ID: Xavier RenshawJoseph L Ford MRN: [id number], DOB/AGE: 1955/12/25   Admit date: 11/22/2017 Date of Consult: 11/23/2017  Primary Physician: Aldona Barampos, Claudia, MD Primary Cardiologist: No primary care provider on file. Requesting Provider: Dr. Selena BattenKim  Patient Profile    Xavier Ford is an obese 62 y.o. male with a history of HTN, Afib / Aflutter with RVR s/p Aflutter Ablation 01/2017 at [facility] & on chronic anticoagulation (Eliquis), mild to moderate non-obstructive CAD (2019 Echo EF 65-70%; LHC 06/2016), CKD III, DM2, OSA, and schizoaffective disorder with h/o cocaine abuse and polysubstance abuse (THC, Cocaine) who is being seen today for the evaluation of elevated blood pressure and left sided chest pain from [facility] and at the request of Dr. Selena BattenKim.  Past Medical History   Past Medical History:  Diagnosis Date  . Atrial fibrillation (HCC)   . CKD (chronic kidney disease), stage III (HCC)   . Diabetes (HCC)   . Gout   . Hypertension   . Schizoaffective disorder [facility](HCC)     Past Surgical History:  Procedure Laterality Date  . Left finger pinning    . LEFT HEART CATH AND CORONARY ANGIOGRAPHY N/A 06/27/2016   Procedure: Left Heart Cath and Coronary Angiography;  Surgeon: Yvonne Kendallhristopher End, MD;  [facility];  Service: Cardiovascular;  Laterality: N/A;  . ORIF left radius       Allergies  Allergies  Allergen Reactions  . Morphine Itching  . Tolmetin Hives and Swelling  . Beta Adrenergic Blockers Other (See Comments)    Cocaine Abuse  . Ibuprofen Hives  . Naproxen Sodium Hives  . Nsaids Hives  . Colchicine Rash    Patient states that he was told to stay off of this med due to "kidney problems"  . Gabapentin     Other reaction(s): Cough (ALLERGY/intolerance), GI Upset (intolerance) Patient states after taking the medication, his throat started swelling, he had a horrible headache and abd cramping.  . Morphine And Related Itching     History of Present Illness    62 year old obese male with past medical history as above and currently a patient at [facility] and medication compliant. He reports no cocaine abuse for 1 year.    06/27/2016: [facility] showed mild to moderate nonobstructive coronary artery disease involving LAD/D3 & LCx/OM 3 with normal LV filling pressure.  01/19/2017: [facility] ablation for uncomplicated and chronic presentation with A.flutter. Bidirectional CTI conduction block achieved by Hassan Bucklerr.Lai Kok. TEE normal at that time with no thrombus seen in the appendage.  VQ scan performed at that time showed no significant perfusion defects or mismatches with low probability for pulmonary embolism.   Patient [facility] at [facility] as above. ~3 days ago, patient reports he felt mild but worsening non-radiating left-sided chest pressure that started at rest and continued through the weekend,. He denies any alleviating or worsening factors. On 11/22/17, he states he felt diaphoretic with the worsening CP; therefore, the [facility] staff took his blood pressure and [facility] as it was elevated. In the ED, vitals were significant for blood pressure 140/93, heart rate 90 bpm, 98% ORA.  Troponin minimally elevated & flat trending (0.03  0.04  0.03).  EKG significant for typical R atrium A flutter with ventricular rate 113 bpm.  Labs Hgb 17.3, glucose 156, BUN 30, Cr 1.72, potassium 4.0, sodium 139, calcium 9.3, AST 24, ALT 22.  11/18/2016 TSH 1.419. Patient was started on Cardizem gtt  and order placed for TTE with results as documented below with EF 65-70%.   Since admission, ventricular rates have been hard to control with HR 80s-high150s. Today (11/23/17) telemetry shows Afib / NSR / A.Tachycardia with patient in/out of Afib / NSR on exam. He continues to report he is asymptomatic. Cardizem drip restarted and increased with EP messaged for further input regarding further rate control and antiarrhytmic.     Inpatient Medications    . allopurinol  300 mg Oral Daily  . apixaban  5 mg Oral BID  . chlorthalidone  25 mg Oral Daily  . doxepin  25 mg Oral Daily  . insulin aspart  0-5 Units Subcutaneous QHS  . insulin aspart  0-9 Units Subcutaneous TID WC  . isosorbide mononitrate  30 mg Oral Daily  . nitroGLYCERIN  1 inch Topical Q8H  . pantoprazole  40 mg Oral Daily  . polyethylene glycol  17 g Oral Daily  . predniSONE  10 mg Oral Daily  . rosuvastatin  10 mg Oral QPM  . sodium chloride flush  3 mL Intravenous Q12H    Family History    Family History  Problem Relation Age of Onset  . Hyperlipidemia Maternal Grandmother    He indicated that the status of his maternal grandmother is unknown.   Social History    Social History   Socioeconomic History  . Marital status: Single    Spouse name: Not on file  . Number of children: Not on file  . Years of education: Not on file  . Highest education level: Not on file  Occupational History  . Not on file  Social Needs  . Financial resource strain: Not very hard  . Food insecurity:    Worry: Never true    Inability: Never true  . Transportation needs:    Medical: No    Non-medical: No  Tobacco Use  . Smoking status: Never Smoker  . Smokeless tobacco: Never Used  Substance and Sexual Activity  . Alcohol use: No  . Drug use: Yes    Types: Cocaine  . Sexual activity: Not on file  Lifestyle  . Physical activity:    Days per week: Patient refused    Minutes per session: Patient refused  . Stress: Only a little  Relationships  . Social connections:    Talks on phone: Three times a week    Gets together: Once a week    Attends religious service: Patient refused    Active member of club or organization: Patient refused    Attends meetings of clubs or organizations: Never    Relationship status: Patient refused  . Intimate partner violence:    Fear of current or ex partner: Patient refused    Emotionally abused: Patient  refused    Physically abused: Patient refused    Forced sexual activity: Patient refused  Other Topics Concern  . Not on file  Social History Narrative  . Not on file     Review of Systems    General:  No chills, fever, night sweats or weight changes.  Cardiovascular:  +++Minimal and decreasing L sided & non-radiating chest pain, no dyspnea on exertion, edema, orthopnea, palpitations, paroxysmal nocturnal dyspnea. Dermatological: No rash, lesions/masses Respiratory: No cough, dyspnea Urologic: No hematuria, dysuria Abdominal:   No nausea, vomiting, diarrhea, bright red blood per rectum, melena, or hematemesis Neurologic:  No visual changes, wkns, changes in mental status. All other systems reviewed and are otherwise negative except as noted above.  Physical Exam    Blood pressure 136/87, pulse 79, temperature 98.2 F (36.8 C), temperature source Oral, resp. rate (!) 23, height 5\' 11"  (1.803 m), weight 117.6 kg, SpO2 96 %.  General: Pleasant, NAD. Lying in bed.  Psych: Normal affect. Neuro: Alert and oriented X 3. Moves all extremities spontaneously. HEENT: Normal  Neck: Supple without bruits or JVD. Lungs:  Resp regular and unlabored, CTA. Heart: in/out of IRIR & NSR, no s3, s4, or murmurs. Abdomen: Soft, non-tender, non-distended, BS + x 4.  Extremities: No clubbing, cyanosis or edema. DP/PT/Radials 2+ and equal bilaterally.  Labs    Troponin (Point of Care Test) No results for input(s): TROPIPOC in the last 72 hours. Recent Labs    11/22/17 1419 11/22/17 2143 11/23/17 0301 11/23/17 0932  TROPONINI 0.04* <0.03 0.04* 0.03*   Lab Results  Component Value Date   WBC 8.4 11/23/2017   HGB 15.2 11/23/2017   HCT 46.2 11/23/2017   MCV 91.8 11/23/2017   PLT 290 11/23/2017    Recent Labs  Lab 11/23/17 0301  NA 139  K 4.0  CL 100  CO2 27  BUN 26*  CREATININE 1.83*  CALCIUM 9.3  PROT 7.0  BILITOT 0.7  ALKPHOS 66  ALT 22  AST 24  GLUCOSE 95   Lab Results    Component Value Date   CHOL 165 06/28/2016   HDL 44 06/28/2016   LDLCALC 105 (H) 06/28/2016   TRIG 78 06/28/2016   No results found for: [facility]   Radiology Studies    Dg Chest 2 View  Result Date: 11/22/2017 CLINICAL DATA:  Tachycardia, atrial fibrillation. EXAM: CHEST - 2 VIEW COMPARISON:  None. FINDINGS: The mediastinal contour is normal. Heart size is enlarged. Both lungs are clear. The visualized skeletal structures are unremarkable. IMPRESSION: No active cardiopulmonary disease. Cardiomegaly. Electronically Signed   By: Sherian Rein M.D.   On: 11/22/2017 14:07    ECG & Cardiac Imaging    11/19/2017 TTE Study Conclusions - Left ventricle: The cavity size was normal. Wall thickness was   increased in a pattern of moderate LVH. Systolic function was   vigorous. The estimated ejection fraction was in the range of 65%   to 70%. Wall motion was normal; there were no regional wall   motion abnormalities. Doppler parameters are consistent with   abnormal left ventricular relaxation (grade 1 diastolic   dysfunction). The E/e&' ratio is between 8-15, suggesting   indetermiante LV filling pressure. - Mitral valve: Mildly thickened leaflets . There was trivial   regurgitation. - Left atrium: The atrium was normal in size. - Right atrium: The atrium was normal in size. - Tricuspid valve: There was mild regurgitation. - Pulmonary arteries: PA peak pressure: 27 mm Hg (S). - Inferior vena cava: The vessel was normal in size. The   respirophasic diameter changes were in the normal range (>= 50%),   consistent with normal central venous pressure. Impressions: - LVEF 65-70%, moderate LVH, normal wall motion, grade 1 DD,   indeterminate LV filling pressure, trivial MR, normal biatrial   size, mild TR, RVSP 27 mmHg, normal IVC.  01/19/2017 TEE The left ventricle is normal in size. The left ventricular ejection fraction is normal. The left ventricular wall motion is normal. There is  mild mitral regurgitation (1+). Left atrial enlargement There is no thrombus seen in the appendage.  06/27/2016 LHC Conclusions: 1.  Mild to moderate, non-obstructive coronary artery disease involving the LAD/D3 and LCx/OM3. 2.  Normal left  ventricular filling pressure.  Assessment & Plan    1. Afib/Aflutter with RVR - Patient with minimal to none reported left sided chest pain   - 01/19/2017- uncomplicated atrial flutter ablation (Lear NgLai Chow Kok, [facility], [facility]), TEE, & VQ scan.  - Most recent cardiac studies, EKG, and imaging as above - Troponin minimally elevated and flat trending at 0.04  0.03 - Electrolytes stable with 8/12 K+ 4.0, 8/7 Mg 2.4 - 11/23/17 = In/out of Afib and NSR on telemetry and exam - CHA2DS2VASC at least 2 (HTN, CAD) - Continue Eliquis for uninterrupted anticoagulation to reduce risk for stroke  - Cardizem drip restarted and increased for additional rate control  - Plan to transition Cardizem drip  PO prior to discharge  - Will likely need additional rate and rhythm control as per EP. Caution with QT prolonging medications. Caution with BB considering past (recent) h/o cocaine abuse as below.  - Patient is not a candidate for additional ablation attempt   2. Non-Obstructive CAD - Current minimal L sided chest pain as above  - Continue Imdur 30mg  po qd, nitro for CP - Continue Crestor 10mg  po qd - Consider further outpatient cardiac workup if chest pain continues after discharge.   3. HTN - BP since admission up to high 180s systolic and 90s diastolic  - Continue chlorthalidone tablet 25mg  po qd   4. DM2 - Continue insulin - Per IM  5. Chronic Kidney Disease, III - Not on lisinopril  - Caution with NSAIDs given CKD, HTN, CAD as above  6. OSA - Wears CPAP  7. H/o polysubstance abuse, cocaine and cannabis - Counseling performed.  - Reports last cocaine use ~1 year ago (2018)  - [facility]  8. Gout - Per  IM  9. Schizoaffective disorder - On Doxepin 25mg  - Per IM   For questions or updates, please contact [facility] Please consult [email or web address] for contact info under Cardiology/STEMI.      Lynelle SmokeSigned, Jacquelyn D Visser, PA-C  Pager [phone] 11/23/2017, 1:27 PM     Patient was seen independently with Marisue IvanJacquelyn Visser NP, above note reviewed and treatment plan discussed.  Azalee CourseHao Meng PA-C  I have seen and examined the patient along with Lennon AlstromJacquelyn D Visser, PA-C and Azalee CourseHao Meng, [place].  I have reviewed the chart, notes and new data.  I agree with the PA's note.  Key new complaints: unaware of arrhythmia, really no CV complaints Key examination changes: obese, irregular rhythm, otherwise unremarkable CV exam Key new findings / data: telemetry shows a broad array of atrial tachyarrhythmia including typical counterclockwise RA flutter, atrial fibrillation with RVR, ectopic atrial tachycardia (possibly pulmonary vein origin?), generally with very poor rate control.  PLAN: Beta blockers are not a true allergy - they were considered contraindicated due to active cocaine use. Needs better rate control - will add carvedilol since it's alpha blocker properties may alleviate some of the concern if he has cocaine relapse. He will need a true antiarrhythmic as well. Choices are limited due to his psychotropic meds and issues surrounding QT prolongation. Discussed with Dr. Graciela HusbandsKlein by phone. Will try flecainide while he is an inpatient.  Thurmon FairMihai Josiel Gahm, MD, [facility] [phone] 11/23/2017, 4:53 PM

## 2017-11-24 ENCOUNTER — Inpatient Hospital Stay (HOSPITAL_COMMUNITY): Payer: Medicaid Other

## 2017-11-24 DIAGNOSIS — I2510 Atherosclerotic heart disease of native coronary artery without angina pectoris: Secondary | ICD-10-CM

## 2017-11-24 DIAGNOSIS — R748 Abnormal levels of other serum enzymes: Secondary | ICD-10-CM

## 2017-11-24 DIAGNOSIS — I10 Essential (primary) hypertension: Secondary | ICD-10-CM

## 2017-11-24 DIAGNOSIS — E7800 Pure hypercholesterolemia, unspecified: Secondary | ICD-10-CM

## 2017-11-24 DIAGNOSIS — I5030 Unspecified diastolic (congestive) heart failure: Secondary | ICD-10-CM

## 2017-11-24 DIAGNOSIS — M1A3210 Chronic gout due to renal impairment, right elbow, without tophus (tophi): Secondary | ICD-10-CM

## 2017-11-24 LAB — CBC
HCT: 47.2 % (ref 39.0–52.0)
Hemoglobin: 15.5 g/dL (ref 13.0–17.0)
MCH: 29.9 pg (ref 26.0–34.0)
MCHC: 32.8 g/dL (ref 30.0–36.0)
MCV: 91.1 fL (ref 78.0–100.0)
PLATELETS: 278 10*3/uL (ref 150–400)
RBC: 5.18 MIL/uL (ref 4.22–5.81)
RDW: 13.5 % (ref 11.5–15.5)
WBC: 8.5 10*3/uL (ref 4.0–10.5)

## 2017-11-24 LAB — COMPREHENSIVE METABOLIC PANEL
ALBUMIN: 3.8 g/dL (ref 3.5–5.0)
ALT: 21 U/L (ref 0–44)
AST: 22 U/L (ref 15–41)
Alkaline Phosphatase: 68 U/L (ref 38–126)
Anion gap: 11 (ref 5–15)
BUN: 27 mg/dL — AB (ref 8–23)
CHLORIDE: 99 mmol/L (ref 98–111)
CO2: 25 mmol/L (ref 22–32)
CREATININE: 1.77 mg/dL — AB (ref 0.61–1.24)
Calcium: 9.1 mg/dL (ref 8.9–10.3)
GFR calc non Af Amer: 39 mL/min — ABNORMAL LOW (ref >60)
GFR, EST AFRICAN AMERICAN: 46 mL/min — AB (ref >60)
Glucose, Bld: 112 mg/dL — ABNORMAL HIGH (ref 70–99)
Potassium: 3.8 mmol/L (ref 3.5–5.1)
SODIUM: 135 mmol/L (ref 135–145)
Total Bilirubin: 0.8 mg/dL (ref 0.3–1.2)
Total Protein: 7.1 g/dL (ref 6.5–8.1)

## 2017-11-24 LAB — ECHOCARDIOGRAM COMPLETE
Height: 71 in
WEIGHTICAEL: 4162.28 [oz_av]

## 2017-11-24 LAB — GLUCOSE, CAPILLARY
Glucose-Capillary: 97 mg/dL (ref 70–99)
Glucose-Capillary: 134 mg/dL — ABNORMAL HIGH (ref 70–99)

## 2017-11-24 MED ORDER — DILTIAZEM HCL ER COATED BEADS 180 MG PO CP24
360.0000 mg | ORAL_CAPSULE | Freq: Every day | ORAL | Status: DC
  Administered 2017-11-24: 360 mg via ORAL
  Filled 2017-11-24: qty 2

## 2017-11-24 MED ORDER — CARVEDILOL 6.25 MG PO TABS: 6.2500 mg | ORAL_TABLET | Freq: Two times a day (BID) | ORAL | 0 refills | Status: AC

## 2017-11-24 MED ORDER — FLECAINIDE ACETATE 50 MG PO TABS: 50.0000 mg | ORAL_TABLET | Freq: Two times a day (BID) | ORAL | 0 refills | Status: AC

## 2017-11-24 MED ORDER — TRAMADOL HCL 50 MG PO TABS: 50.0000 mg | ORAL_TABLET | Freq: Four times a day (QID) | ORAL | 0 refills | Status: AC | PRN

## 2017-11-24 NOTE — Progress Notes (Signed)
Per CSW, [facility] has declined to take pt back into their program, stating that pt is too "medically complex".  Notified pt of this, as pt has not been notified.  He is obviously upset about this; wants to know why they are refusing him and how he will get his belongings back.  He does not have [facility] home or to [facility].  CSW notified to follow up with pt regarding transportation issues. Pt to follow up with cardiologist and [place] [facility] within the next two weeks.    Xavier BatonJulie W. Aziz Slape, RN, BSN  Trauma/Neuro ICU Case Manager [phone]

## 2017-11-24 NOTE — Progress Notes (Addendum)
Clinical Social Worker was informed by medical staff that [facility] was [facility] to [facility]. CSW reached out to facility and they stated that patient is to medically complex and they will not take patient back to there program. CSW informed RNCM of [facility] decision.  3:30pm:  Patient needing assistance with transportation home. CSW gave RN on floor cab voucher to use once patient is ready. Address verified and patient agreeable with transportation   Xavier Ford, MSW,  EllisLCSWA [phone]

## 2017-11-24 NOTE — Discharge Summary (Addendum)
DISCHARGE SUMMARY  Xavier Ford  MR#: [id number]  DOB:12-Nov-1955  Date of Admission: 11/22/2017 Date of Discharge: 11/24/2017  Attending Physician:Aashika Carta Hennie Duos, MD  Patient's GUR:[id number], Xavier Gobble, MD  Consults:  [facility]   Disposition: D/C home   Follow-up Appts: Follow-up Information    Your Primary Care Physiciain Follow up.   Why:  See your Primary Care Physiciain in [facility] in 7-10 days to have your blood sugars and your blood pressure checked.  This is VERY IMPORTANT!       Croitoru, Mihai, MD Follow up in 2 week(s).   Specialty:  Cardiology Why:  The office will call  you to arrange an appointment in 2 weeks.   Contact information: [address] [place] [address] [phone]           Tests Needing Follow-up: -assess HR / rhythm control -assess BP control -assess CBG/DM control - may require initiation of medical tx  -assure pt abstaining from cocaine   Discharge Diagnoses: Chest pain Chronic Afib / Aflutter w/ acute RVR CKD Stage 3 HTN Cocaine abuse DM HLD GERD Gout  Hx OSA Obesity - Body mass index is 36.17 kg/m.  Initial presentation: 62 y.o.male w/ a hx of HTN, Parox Afib, CKD stage 3, Gout, and Schizoaffective disorder who presented w/ chest pain in the middle of the chest along with high bp and slight dyspnea.  In ED his EKG noted afib/ flutter.  Hospital Course:  Chest pain Troponin elevation likely rate related in setting of RVR - pain resolved - Cards has evaluated - cont medical management   Chronic Afib / Aflutter w/ acute RVR TSH normal - Mg normal - Cards added BB and flecainide w/ great results and pt was able to be rapidly weaned off the CCB gtt - he is in NSR at time of d/c   CKD Stage 3 Renal fxn was stable during this hospital stay   HTN Medical tx adjusted during this hospital stay - BP not yet at goal at time of d/c - will need outpt f/u of BP and probable further titration  of meds   Cocaine abuse last use 30 days prior to this admit - has been counseled on absolute need to fully abstain - is currently undergoing tx for same, and intends to resume this as soon as he is discharged   DM A1c c/w DM at 6.5 - not on meds at home - CBG ranged from 97-229 over last 36hrs of hospital stay - educated on diet - may soon require initiation of DM meds, especially in face of chronic steroid use for gout   HLD Cont crestor  Gout  On chronic steroid tx - suffered a mild flair during hospital stay which has responded to simple analgesia w/ ultram - avoid increased steroid as able due to new DM - avoid colchicine given worsening renal failure   Hx OSA needs formal outpatient testing - defer to PCP  Obesity - Body mass index is 36.17 kg/m.  Allergies as of 11/24/2017      Reactions   Morphine Itching   Tolmetin Hives, Swelling   Beta Adrenergic Blockers Other (See Comments)   Cocaine Abuse   Ibuprofen Hives   Naproxen Sodium Hives   Nsaids Hives   Colchicine Rash   Patient states that he was told to stay off of this med due to "kidney problems"   Gabapentin    Other reaction(s): Cough (ALLERGY/intolerance), GI Upset (intolerance) Patient states  after taking the medication, his throat started swelling, he had a horrible headache and abd cramping.   Morphine And Related Itching      Medication List    STOP taking these medications   COUGH DROPS MT   DULoxetine 30 MG capsule Commonly known as:  CYMBALTA   ibuprofen 200 MG tablet Commonly known as:  ADVIL,MOTRIN   loperamide 2 MG capsule Commonly known as:  IMODIUM   magnesium citrate Soln   magnesium hydroxide 400 MG/5ML suspension Commonly known as:  MILK OF MAGNESIA   naproxen sodium 220 MG tablet Commonly known as:  ALEVE   nicotine 21 mg/24hr patch Commonly known as:  NICODERM CQ - dosed in mg/24 hours   senna-docusate 8.6-50 MG tablet Commonly known as:  Senokot-S     TAKE these  medications   acetaminophen 500 MG tablet Commonly known as:  TYLENOL Take 2 tablets (1,000 mg total) by mouth every 8 (eight) hours as needed for mild pain. Max dose daily of tylenol is 4 grams   allopurinol 300 MG tablet Commonly known as:  ZYLOPRIM Take 300 mg by mouth daily.   alum & mag hydroxide-simeth 200-200-20 MG/5ML suspension Commonly known as:  MAALOX/MYLANTA Take 10-20 mLs by mouth 4 (four) times daily as needed for indigestion or heartburn (DO NOT EXCEED 80MLS IN 24HRS).   apixaban 5 MG Tabs tablet Commonly known as:  ELIQUIS Take 1 tablet (5 mg total) by mouth 2 (two) times daily.   blood glucose meter kit and supplies Kit Dispense based on patient and insurance preference. Use up to four times daily as directed. (FOR ICD-9 250.00, 250.01).   carvedilol 6.25 MG tablet Commonly known as:  COREG Take 1 tablet (6.25 mg total) by mouth 2 (two) times daily with a meal.   chlorthalidone 25 MG tablet Commonly known as:  HYGROTON Take 25 mg by mouth daily.   diltiazem 360 MG 24 hr capsule Commonly known as:  CARDIZEM CD Take 360 mg by mouth daily.   diphenhydrAMINE 25 MG tablet Commonly known as:  BENADRYL Take 25 mg by mouth every 6 (six) hours as needed (anxiety).   docusate sodium 100 MG capsule Commonly known as:  COLACE Take 100 mg by mouth 2 (two) times daily as needed for mild constipation.   doxepin 25 MG capsule Commonly known as:  SINEQUAN Take 25 mg by mouth daily.   flecainide 50 MG tablet Commonly known as:  TAMBOCOR Take 1 tablet (50 mg total) by mouth every 12 (twelve) hours.   hydrALAZINE 25 MG tablet Commonly known as:  APRESOLINE Take 25 mg by mouth every 8 (eight) hours.   isosorbide mononitrate 30 MG 24 hr tablet Commonly known as:  IMDUR Take 30 mg by mouth daily.   multivitamin with minerals Tabs tablet Take 1 tablet by mouth daily.   pantoprazole 40 MG tablet Commonly known as:  PROTONIX Take 1 tablet (40 mg total) by mouth  daily. While on steroids What changed:  additional instructions   polyethylene glycol packet Commonly known as:  MIRALAX / GLYCOLAX Take 17 g by mouth daily.   predniSONE 10 MG tablet Commonly known as:  DELTASONE Take 10 mg by mouth daily.   rosuvastatin 10 MG tablet Commonly known as:  CRESTOR Take 10 mg by mouth every evening.   traMADol 50 MG tablet Commonly known as:  ULTRAM Take 1 tablet (50 mg total) by mouth every 6 (six) hours as needed for severe pain (if tylenol not effective).  Day of Discharge BP (!) 150/102 (BP Location: Left Arm)   Pulse 75   Temp 98 F (36.7 C) (Oral)   Resp (!) 22   Ht 5' 11"  (1.803 m)   Wt 118 kg   SpO2 99%   BMI 36.28 kg/m   Physical Exam: General: No acute respiratory distress Lungs: Clear to auscultation bilaterally without wheezes or crackles Cardiovascular: Regular rate and rhythm without murmur gallop or rub normal S1 and S2 Abdomen: Nontender, nondistended, soft, bowel sounds positive, no rebound, no ascites, no appreciable mass Extremities: No significant cyanosis, clubbing, or edema bilateral lower extremities  Basic Metabolic Panel: Recent Labs  Lab 11/18/17 0922 11/19/17 0511 11/22/17 1450 11/23/17 0301 11/24/17 0319  NA 137 138 137 139 135  K 3.7 4.4 4.2 4.0 3.8  CL 100 95* 100 100 99  CO2 27 29 26 27 25   GLUCOSE 114* 99 156* 95 112*  BUN 30* 31* 30* 26* 27*  CREATININE 1.88* 1.95* 1.72* 1.83* 1.77*  CALCIUM 9.1 9.3 9.3 9.3 9.1  MG 2.4  --   --   --   --     Liver Function Tests: Recent Labs  Lab 11/22/17 1450 11/23/17 0301 11/24/17 0319  AST 29 24 22   ALT 23 22 21   ALKPHOS 76 66 68  BILITOT 0.5 0.7 0.8  PROT 8.2* 7.0 7.1  ALBUMIN 4.4 3.8 3.8    CBC: Recent Labs  Lab 11/18/17 0922 11/19/17 0511 11/22/17 1419 11/23/17 0301 11/24/17 0319  WBC 9.4 8.1 9.0 8.4 8.5  NEUTROABS 6.1  --  6.5  --   --   HGB 16.6 15.9 17.3* 15.2 15.5  HCT 47.8 48.2 50.6 46.2 47.2  MCV 87.9 91.3 89.4 91.8  91.1  PLT 295 301 301 290 278    Cardiac Enzymes: Recent Labs  Lab 11/19/17 0511 11/22/17 1419 11/22/17 2143 11/23/17 0301 11/23/17 0932  TROPONINI 0.03* 0.04* <0.03 0.04* 0.03*    CBG: Recent Labs  Lab 11/23/17 1138 11/23/17 1713 11/23/17 2114 11/24/17 0815 11/24/17 1142  GLUCAP 195* 149* 229* 97 134*    Recent Results (from the past 240 hour(s))  MRSA PCR Screening     Status: None   Collection Time: 11/18/17  4:00 PM  Result Value Ref Range Status   MRSA by PCR NEGATIVE NEGATIVE Final    Comment:        The GeneXpert MRSA Assay (FDA approved for NASAL specimens only), is one component of a comprehensive MRSA colonization surveillance program. It is not intended to diagnose MRSA infection nor to guide or monitor treatment for MRSA infections. Performed at [facility], [place] [address], [place], [place] [address]      Time spent in discharge (includes decision making & examination of pt): 30 minutes  11/24/2017, 2:19 PM   Xavier Altes, MD [facility] Office  [phone] Pager [phone]  On-Call/Text Page:      [email or web address]      password [facility]

## 2017-11-24 NOTE — Progress Notes (Signed)
D[facility]C [facility] and [facility]

## 2017-11-24 NOTE — Progress Notes (Signed)
Progress Note  Patient Name: Xavier Ford Date of Encounter: 11/24/2017  Primary Cardiologist: No primary care provider on file.   Subjective   Feels well, had a good night sleep.  Complains of gout attack in his right great toe.  Inpatient Medications    Scheduled Meds: . allopurinol  300 mg Oral Daily  . apixaban  5 mg Oral BID  . carvedilol  6.25 mg Oral BID WC  . chlorthalidone  25 mg Oral Daily  . diltiazem  360 mg Oral Daily  . doxepin  25 mg Oral Daily  . flecainide  50 mg Oral Q12H  . insulin aspart  0-5 Units Subcutaneous QHS  . insulin aspart  0-9 Units Subcutaneous TID WC  . isosorbide mononitrate  30 mg Oral Daily  . nitroGLYCERIN  1 inch Topical Q8H  . pantoprazole  40 mg Oral Daily  . polyethylene glycol  17 g Oral Daily  . predniSONE  10 mg Oral Daily  . rosuvastatin  10 mg Oral QPM  . sodium chloride flush  3 mL Intravenous Q12H  . zolpidem  10 mg Oral QHS   Continuous Infusions: . sodium chloride 5 mL/hr at 11/24/17 0700   PRN Meds: sodium chloride, acetaminophen **OR** [DISCONTINUED] acetaminophen, docusate sodium, sodium chloride flush, traMADol   Vital Signs    Vitals:   11/23/17 2309 11/24/17 0329 11/24/17 0500 11/24/17 0738  BP: (!) 151/76 (!) 145/102  (!) 171/100  Pulse: (!) 47 70  75  Resp: (!) 25 18  (!) 26  Temp:  97.7 F (36.5 C)  97.8 F (36.6 C)  TempSrc:  Axillary  Oral  SpO2: 96% 97%  98%  Weight:   118 kg   Height:        Intake/Output Summary (Last 24 hours) at 11/24/2017 0851 Last data filed at 11/24/2017 0700 Gross per 24 hour  Intake 558.28 ml  Output 850 ml  Net -291.72 ml   Filed Weights   11/22/17 2059 11/23/17 0528 11/24/17 0500  Weight: 118.1 kg 117.6 kg 118 kg    Telemetry    Sinus rhythm with occasional PACs, narrow QRS- Personally Reviewed  ECG    No new tracing- Personally Reviewed  Physical Exam  Obese, looks very comfortable GEN: No acute distress.   Neck: No JVD Cardiac: RRR occasional  ectopy, no murmurs, rubs, or gallops.  Respiratory: Clear to auscultation bilaterally. GI: Soft, nontender, non-distended  MS: No edema; No deformity. Neuro:  Nonfocal  Psych: Normal affect   Labs    Chemistry Recent Labs  Lab 11/22/17 1450 11/23/17 0301 11/24/17 0319  NA 137 139 135  K 4.2 4.0 3.8  CL 100 100 99  CO2 26 27 25   GLUCOSE 156* 95 112*  BUN 30* 26* 27*  CREATININE 1.72* 1.83* 1.77*  CALCIUM 9.3 9.3 9.1  PROT 8.2* 7.0 7.1  ALBUMIN 4.4 3.8 3.8  AST 29 24 22   ALT 23 22 21   ALKPHOS 76 66 68  BILITOT 0.5 0.7 0.8  GFRNONAA 41* 38* 39*  GFRAA 47* 44* 46*  ANIONGAP 11 12 11      Hematology Recent Labs  Lab 11/22/17 1419 11/23/17 0301 11/24/17 0319  WBC 9.0 8.4 8.5  RBC 5.66 5.03 5.18  HGB 17.3* 15.2 15.5  HCT 50.6 46.2 47.2  MCV 89.4 91.8 91.1  MCH 30.6 30.2 29.9  MCHC 34.2 32.9 32.8  RDW 14.2 13.9 13.5  PLT 301 290 278    Cardiac Enzymes Recent Labs  Lab 11/22/17 1419 11/22/17 2143 11/23/17 0301 11/23/17 0932  TROPONINI 0.04* <0.03 0.04* 0.03*   No results for input(s): TROPIPOC in the last 168 hours.   BNPNo results for input(s): BNP, PROBNP in the last 168 hours.   DDimer No results for input(s): DDIMER in the last 168 hours.   Radiology    Dg Chest 2 View  Result Date: 11/22/2017 CLINICAL DATA:  Tachycardia, atrial fibrillation. EXAM: CHEST - 2 VIEW COMPARISON:  None. FINDINGS: The mediastinal contour is normal. Heart size is enlarged. Both lungs are clear. The visualized skeletal structures are unremarkable. IMPRESSION: No active cardiopulmonary disease. Cardiomegaly. Electronically Signed   By: Sherian ReinWei-Chen  Lin M.D.   On: 11/22/2017 14:07    Cardiac Studies   11/19/2017 TTE Study Conclusions - Left ventricle: The cavity size was normal. Wall thickness was increased in a pattern of moderate LVH. Systolic function was vigorous. The estimated ejection fraction was in the range of 65% to 70%. Wall motion was normal; there were no  regional wall motion abnormalities. Doppler parameters are consistent with abnormal left ventricular relaxation (grade 1 diastolic dysfunction). The E/e&' ratio is between 8-15, suggesting indetermiante LV filling pressure. - Mitral valve: Mildly thickened leaflets . There was trivial regurgitation. - Left atrium: The atrium was normal in size. - Right atrium: The atrium was normal in size. - Tricuspid valve: There was mild regurgitation. - Pulmonary arteries: PA peak pressure: 27 mm Hg (S). - Inferior vena cava: The vessel was normal in size. The respirophasic diameter changes were in the normal range (>= 50%), consistent with normal central venous pressure. Impressions: - LVEF 65-70%, moderate LVH, normal wall motion, grade 1 DD, indeterminate LV filling pressure, trivial MR, normal biatrial size, mild TR, RVSP 27 mmHg, normal IVC.  01/19/2017 TEE The left ventricle is normal in size. The left ventricular ejection fraction is normal. The left ventricular wall motion is normal. There is mild mitral regurgitation (1+). Left atrial enlargement There is no thrombus seen in the appendage.  06/27/2016 LHC Conclusions: 1. Mild to moderate, non-obstructive coronary artery disease involving the LAD/D3 and LCx/OM3. 2. Normal left ventricular filling pressure.  Patient Profile     62 y.o. 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 AFib/flutter/tach: Excellent response to flecainide and beta-blocker with complete abolition of any sustained arrhythmia since midnight, compared with incessant  episodes of various types of arrhythmia all day yesterday.  Plan to continue current dose of flecainide and carvedilol as an outpatient. Stop Diltiazem drip and restart his oral diltiazem sustained-release capsule.  On anticoagulation with Eliquis, so far without bleeding complications.  Reviewed the extremely high risk of serious and possibly lethal complications if he uses cocaine with these medications.    From arrhythmia point of view he should be able to go home this afternoon. 2. HTN: His blood pressure remains elevated, but I suspect may improve with switching to all oral medications.  There is room to increase his carvedilol dose if necessary. 3. Cocaine abuse: [facility] be [facility].  Again firmly counseled against future use of any stimulants, especially cocaine. 4. Gout: History of urticaria and angioedema with NSAIDs, also contraindicated due to CKD; history of  rash with colchicine (although he is not sure of the side effect, but reports colchicine was discontinued due to renal dysfunction).  Currently on steroids. 5. CKD 3: Stable renal function.  [facility] will sign off.   Medication Recommendations - New cardiac medications are:  carvedilol 6.25 mg twice daily,  flecainide 50 mg twice daily,  apixaban 5 mg twice daily Other recommendations (labs, testing, etc): Follow-up clinic visit with ECG in 2 weeks Follow up as an outpatient:  Follow-up clinic visit with ECG in 2 weeks. [facility] and [place] cardiology follow-up there, but we will make arrangements for at least one [place] in [place] until he can establish care in [place] hometown.   For questions or updates, please contact [facility] Please consult [email or web address] for contact info under Cardiology/STEMI.      Signed, Thurmon FairMihai Beckett Maden, MD  11/24/2017, 8:51 AM

## 2017-11-24 NOTE — Care Management Note (Signed)
Case Management Note  Patient Details  Name: Xavier Ford MRN: [id number] Date of Birth: 1956/03/31  Subjective/Objective:                    Action/Plan:  [facility].  [facility]  CSW facilitating discharge   Expected Discharge Date:  11/24/17               Expected Discharge Plan:  OP Rehab([facility] - CSW facilitating discharge)  In-House Referral:  Clinical Social Work  Discharge planning Services  CM Consult  Post Acute Care Choice:    Choice offered to:     DME Arranged:    DME Agency:     HH Arranged:    HH Agency:     Status of Service:  Completed, signed off  If discussed at [employer] Meetings, dates discussed:    Additional Comments:  Cherylann ParrClaxton, Deveon Kisiel S, RN 11/24/2017, 2:29 PM

## 2017-12-09 ENCOUNTER — Ambulatory Visit: Payer: Medicaid Other | Admitting: Physician Assistant

## 2017-12-09 NOTE — Progress Notes (Deleted)
Cardiology Office Note    Date:  12/09/2017   ID:  Xavier Ford, DOB May 16, 1955, MRN [id number]  PCP:  Sanda Klein, MD  Cardiologist:  ***   No chief complaint on file.   History of Present Illness:  Xavier Ford is a 62 y.o. male ***    Past Medical History:  Diagnosis Date  . Atrial fibrillation ([place])   . CKD (chronic kidney disease), stage III ([place])   . Diabetes ([place])   . Gout   . Hypertension   . Schizoaffective disorder [facility])     Past Surgical History:  Procedure Laterality Date  . Left finger pinning    . LEFT HEART CATH AND CORONARY ANGIOGRAPHY N/A 06/27/2016   Procedure: Left Heart Cath and Coronary Angiography;  Surgeon: Nelva Bush, MD;  Location: [place] [facility];  Service: Cardiovascular;  Laterality: N/A;  . ORIF left radius      Current Medications: Outpatient Medications Prior to Visit  Medication Sig Dispense Refill  . acetaminophen (TYLENOL) 500 MG tablet Take 2 tablets (1,000 mg total) by mouth every 8 (eight) hours as needed for mild pain. Max dose daily of tylenol is 4 grams 30 tablet 0  . allopurinol (ZYLOPRIM) 300 MG tablet Take 300 mg by mouth daily.    Marland Kitchen alum & mag hydroxide-simeth (MAALOX/MYLANTA) 200-200-20 MG/5ML suspension Take 10-20 mLs by mouth 4 (four) times daily as needed for indigestion or heartburn (DO NOT EXCEED 80MLS IN 24HRS).    Marland Kitchen apixaban (ELIQUIS) 5 MG TABS tablet Take 1 tablet (5 mg total) by mouth 2 (two) times daily. 60 tablet 0  . blood glucose meter kit and supplies KIT Dispense based on patient and insurance preference. Use up to four times daily as directed. (FOR ICD-9 250.00, 250.01). 1 each 0  . carvedilol (COREG) 6.25 MG tablet Take 1 tablet (6.25 mg total) by mouth 2 (two) times daily with a meal. 60 tablet 0  . chlorthalidone (HYGROTON) 25 MG tablet Take 25 mg by mouth daily.    Marland Kitchen diltiazem (CARDIZEM CD) 360 MG 24 hr capsule Take 360 mg by mouth daily.    . diphenhydrAMINE (BENADRYL) 25 MG tablet Take  25 mg by mouth every 6 (six) hours as needed (anxiety).    Marland Kitchen docusate sodium (COLACE) 100 MG capsule Take 100 mg by mouth 2 (two) times daily as needed for mild constipation.    Marland Kitchen doxepin (SINEQUAN) 25 MG capsule Take 25 mg by mouth daily.    . flecainide (TAMBOCOR) 50 MG tablet Take 1 tablet (50 mg total) by mouth every 12 (twelve) hours. 60 tablet 0  . hydrALAZINE (APRESOLINE) 25 MG tablet Take 25 mg by mouth every 8 (eight) hours.    . isosorbide mononitrate (IMDUR) 30 MG 24 hr tablet Take 30 mg by mouth daily.    . Multiple Vitamin (MULTIVITAMIN WITH MINERALS) TABS tablet Take 1 tablet by mouth daily.    . pantoprazole (PROTONIX) 40 MG tablet Take 1 tablet (40 mg total) by mouth daily. While on steroids (Patient taking differently: Take 40 mg by mouth daily. ) 30 tablet 0  . polyethylene glycol (MIRALAX / GLYCOLAX) packet Take 17 g by mouth daily.    . predniSONE (DELTASONE) 10 MG tablet Take 10 mg by mouth daily.    . rosuvastatin (CRESTOR) 10 MG tablet Take 10 mg by mouth every evening.    . traMADol (ULTRAM) 50 MG tablet Take 1 tablet (50 mg total) by mouth every 6 (  six) hours as needed for severe pain (if tylenol not effective). 20 tablet 0   No facility-administered medications prior to visit.      Allergies:   Morphine; Tolmetin; Beta adrenergic blockers; Ibuprofen; Naproxen sodium; Nsaids; Colchicine; Gabapentin; and Morphine and related   Social History   Socioeconomic History  . Marital status: Single    Spouse name: Not on file  . Number of children: Not on file  . Years of education: Not on file  . Highest education level: Not on file  Occupational History  . Not on file  Social Needs  . Financial resource strain: Not very hard  . Food insecurity:    Worry: Never true    Inability: Never true  . Transportation needs:    Medical: No    Non-medical: No  Tobacco Use  . Smoking status: Never Smoker  . Smokeless tobacco: Never Used  Substance and Sexual Activity  .  Alcohol use: No  . Drug use: Yes    Types: Cocaine  . Sexual activity: Not on file  Lifestyle  . Physical activity:    Days per week: Patient refused    Minutes per session: Patient refused  . Stress: Only a little  Relationships  . Social connections:    Talks on phone: Three times a week    Gets together: Once a week    Attends religious service: Patient refused    Active member of club or organization: Patient refused    Attends meetings of clubs or organizations: Never    Relationship status: Patient refused  Other Topics Concern  . Not on file  Social History Narrative  . Not on file     Family History:  The patient's ***family history includes Hyperlipidemia in his maternal grandmother.   ROS:   Please see the history of present illness.    ROS All other systems reviewed and are negative.   PHYSICAL EXAM:   VS:  There were no vitals taken for this visit.   GEN: Well nourished, well developed, in no acute distress HEENT: normal Neck: no JVD, carotid bruits, or masses Cardiac: ***RRR; no murmurs, rubs, or gallops,no edema  Respiratory:  clear to auscultation bilaterally, normal work of breathing GI: soft, nontender, nondistended, + BS MS: no deformity or atrophy Skin: warm and dry, no rash Neuro:  Alert and Oriented x 3, Strength and sensation are intact Psych: euthymic mood, full affect  Wt Readings from Last 3 Encounters:  11/24/17 260 lb 2.3 oz (118 kg)  11/18/17 260 lb 2.3 oz (118 kg)  06/27/16 243 lb 9.7 oz (110.5 kg)      Studies/Labs Reviewed:   EKG:  EKG is*** ordered today.  The ekg ordered today demonstrates ***  Recent Labs: 11/18/2017: Magnesium 2.4; TSH 1.419 11/24/2017: ALT 21; BUN 27; Creatinine, Ser 1.77; Hemoglobin 15.5; Platelets 278; Potassium 3.8; Sodium 135   Lipid Panel    Component Value Date/Time   CHOL 165 06/28/2016 1132   TRIG 78 06/28/2016 1132   HDL 44 06/28/2016 1132   CHOLHDL 3.8 06/28/2016 1132   VLDL 16 06/28/2016 1132    LDLCALC 105 (H) 06/28/2016 1132    Additional studies/ records that were reviewed today include:  ***    ASSESSMENT:    No diagnosis found.   PLAN:  In order of problems listed above:  1. ***    Medication Adjustments/Labs and Tests Ordered: Current medicines are reviewed at length with the patient today.  Concerns regarding medicines  are outlined above.  Medication changes, Labs and Tests ordered today are listed in the Patient Instructions below. There are no Patient Instructions on file for this visit.   Hilbert Corrigan, [place]  12/09/2017 9:13 AM    [place], [place], [place]  [address] Phone: [phone]; Fax: [phone]

## 2017-12-10 ENCOUNTER — Encounter: Payer: Self-pay | Admitting: *Deleted

## 2020-04-22 IMAGING — CR DG CHEST 2V
2 series · 2 of 2 positions shown · non-contrast
Comparison: None.

CLINICAL DATA: Tachycardia, atrial fibrillation.

EXAM:
CHEST - 2 VIEW

[w chest pa]
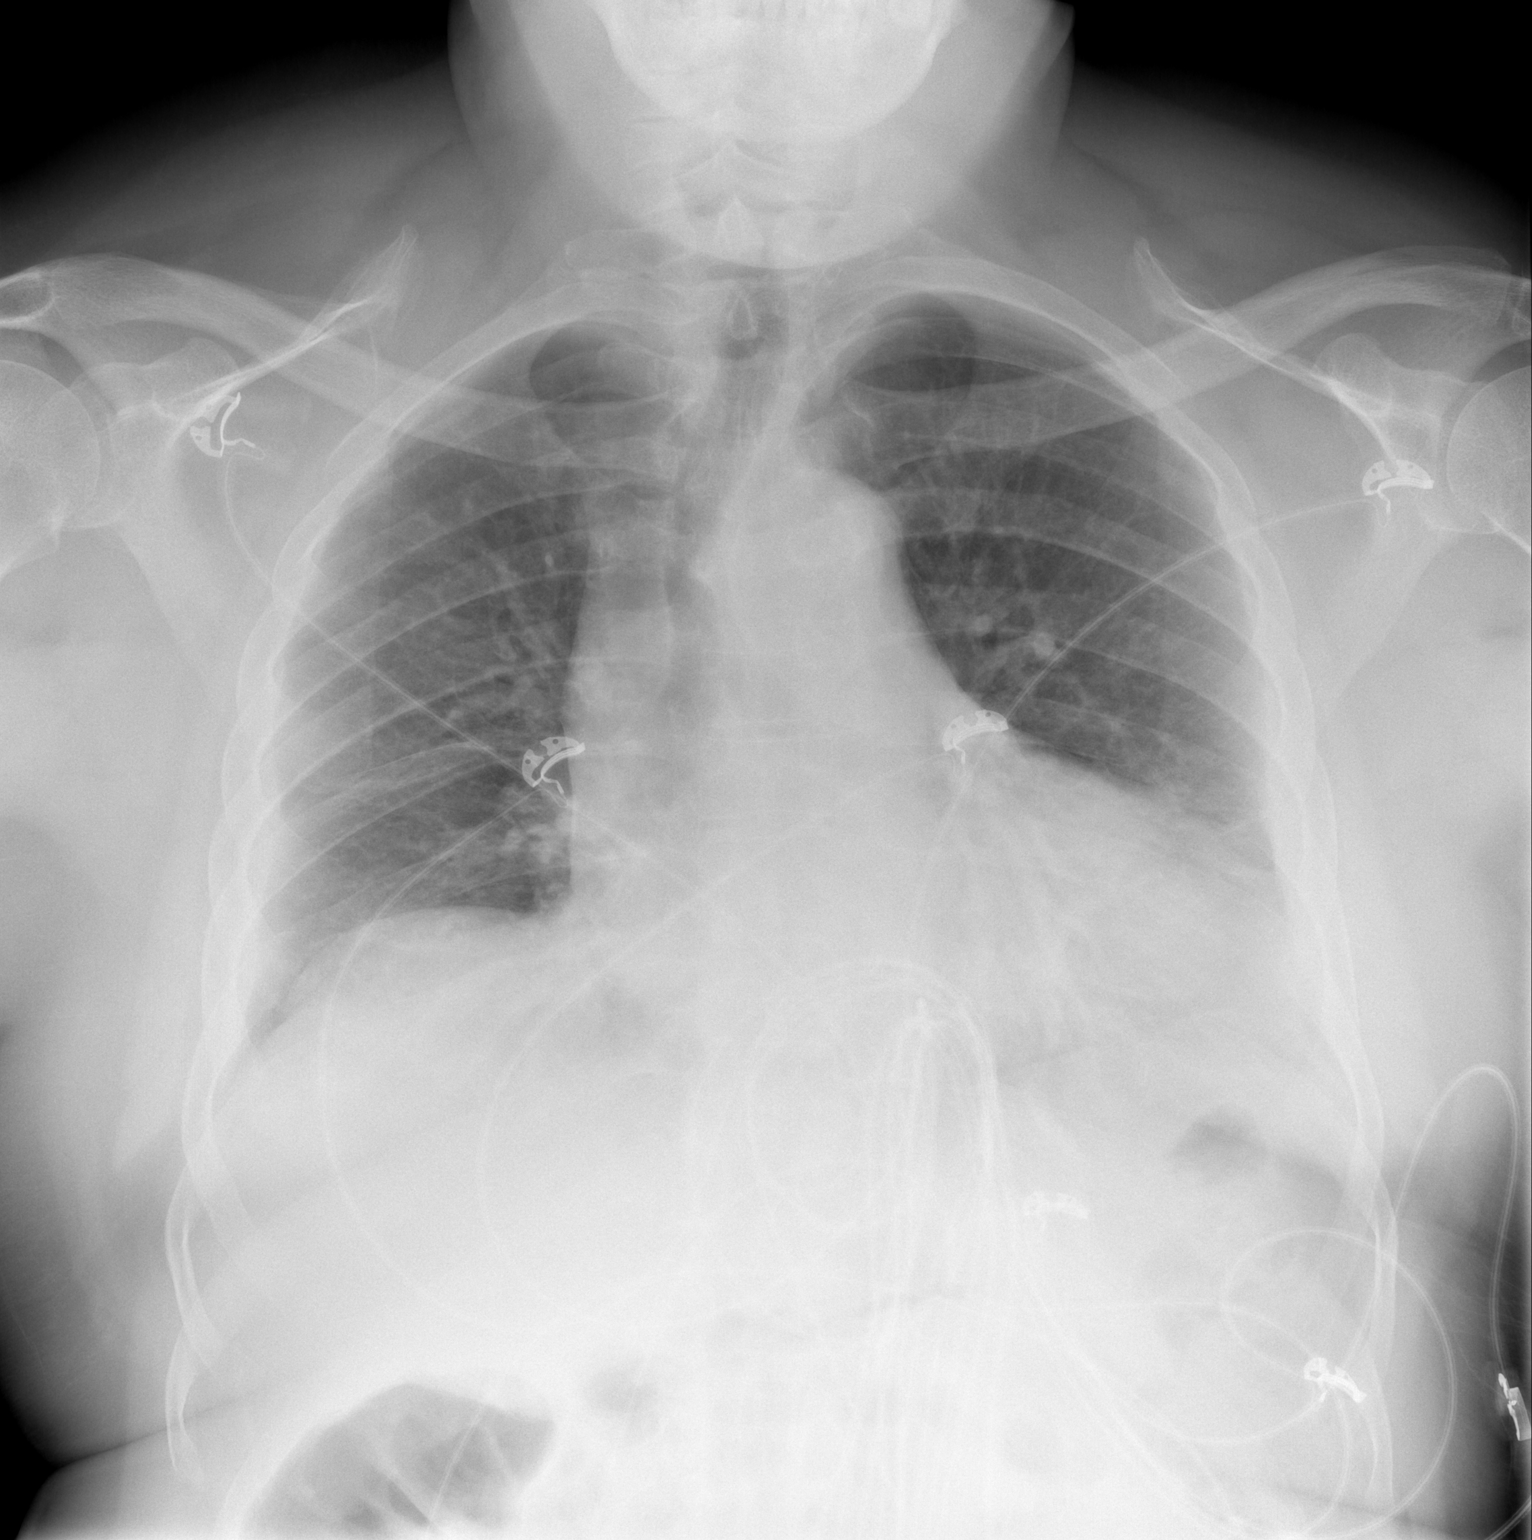

[w chest lat]
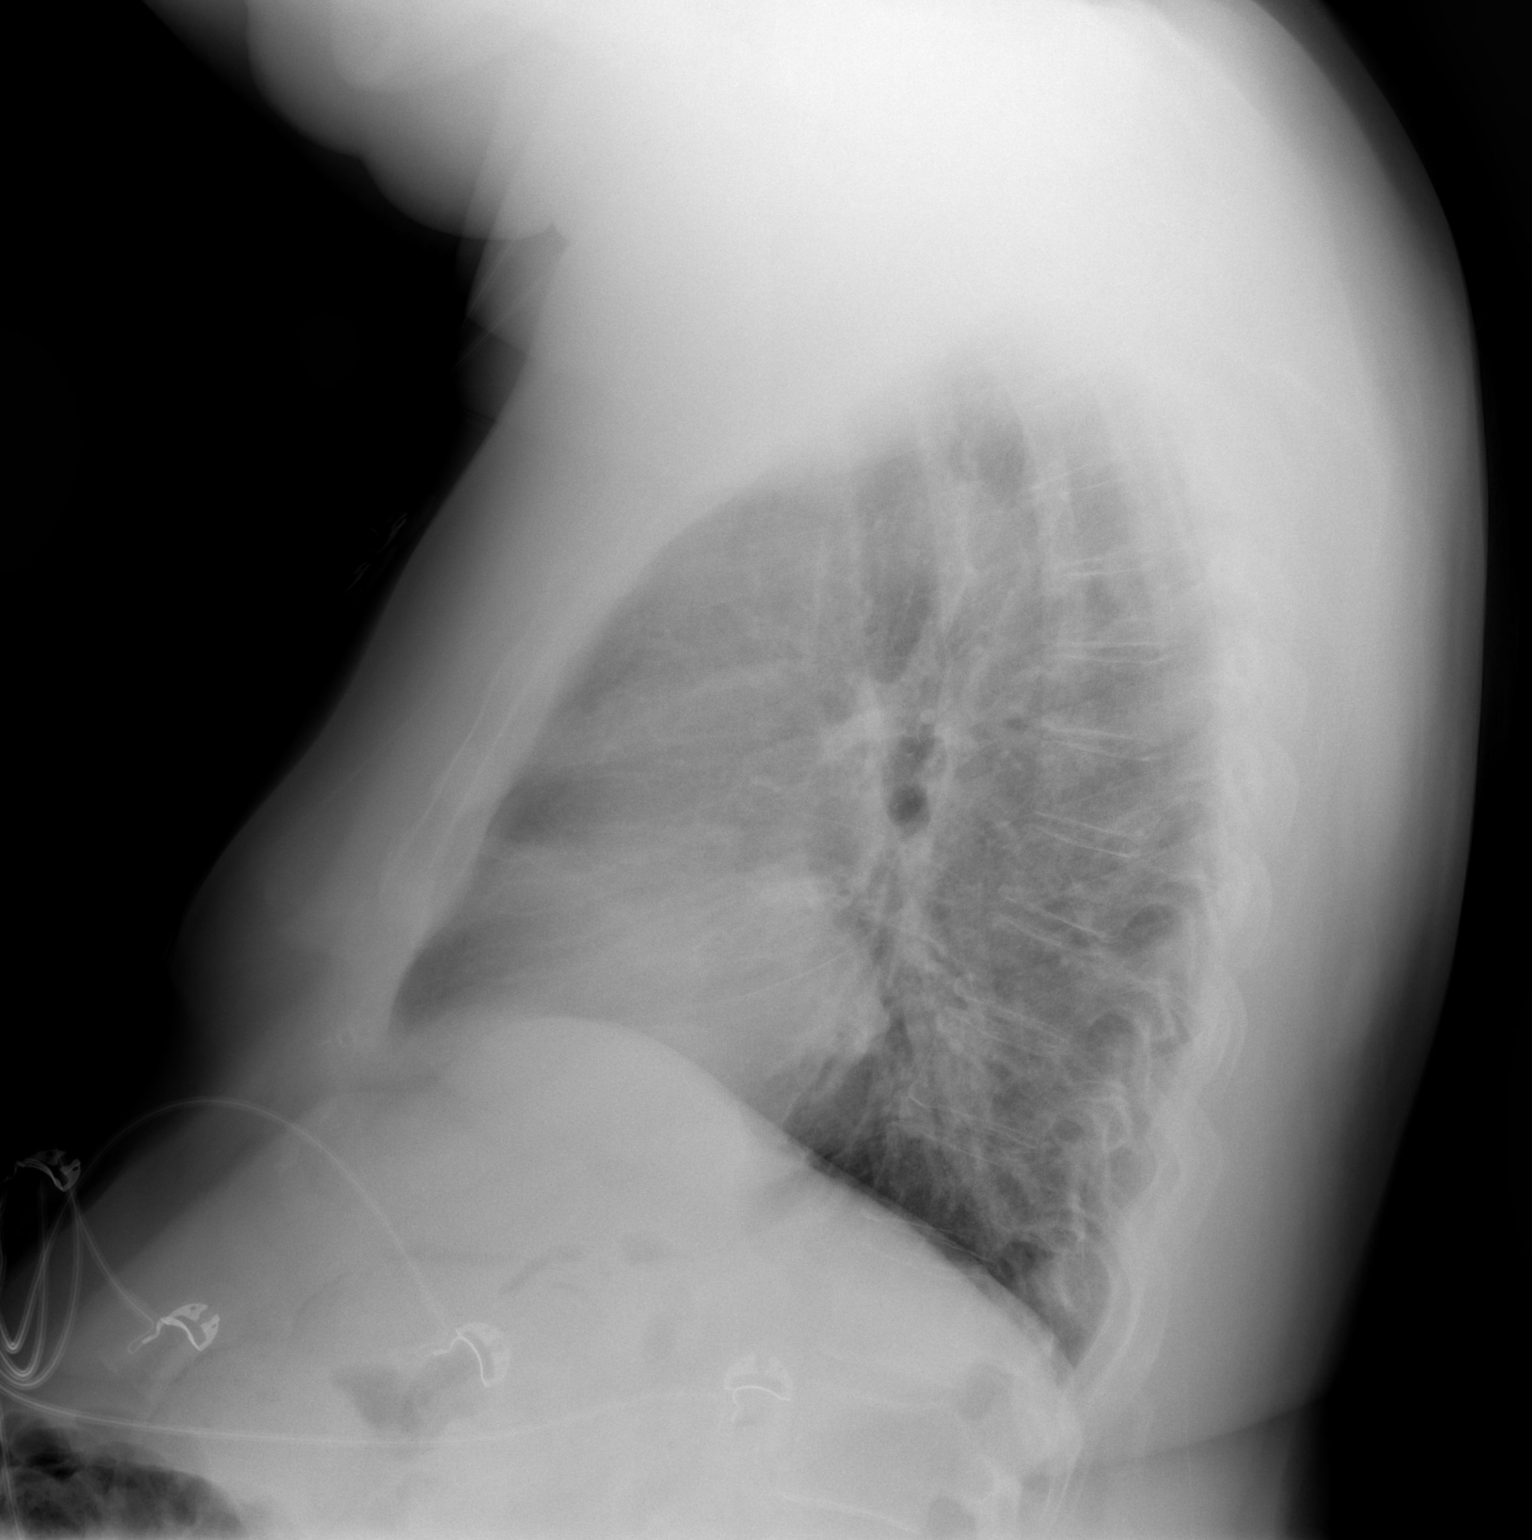

[2 of 2 positions shown; findings below may reference images not displayed]

FINDINGS: The mediastinal contour is normal. Heart size is enlarged. Both
lungs are clear. The visualized skeletal structures are
unremarkable.
IMPRESSION: No active cardiopulmonary disease.

Cardiomegaly.

## 2022-05-15 DEATH — deceased
